# Patient Record
Sex: Male | Born: 1965 | Race: White | Hispanic: No | Marital: Married | Smoking: Former smoker
Health system: Southern US, Community
[De-identification: ages and names within clinical notes are randomized; demographics above are authoritative.]

## PROBLEM LIST (undated history)

## (undated) DIAGNOSIS — F329 Major depressive disorder, single episode, unspecified: Secondary | ICD-10-CM

## (undated) DIAGNOSIS — K219 Gastro-esophageal reflux disease without esophagitis: Secondary | ICD-10-CM

## (undated) DIAGNOSIS — M1990 Unspecified osteoarthritis, unspecified site: Secondary | ICD-10-CM

## (undated) DIAGNOSIS — I10 Essential (primary) hypertension: Secondary | ICD-10-CM

## (undated) DIAGNOSIS — F32A Depression, unspecified: Secondary | ICD-10-CM

## (undated) HISTORY — PX: WISDOM TOOTH EXTRACTION: SHX21

---

## 1992-03-14 HISTORY — PX: KNEE ARTHROSCOPY: SHX127

## 2010-02-26 ENCOUNTER — Encounter
Admission: RE | Admit: 2010-02-26 | Discharge: 2010-02-26 | Payer: Self-pay | Source: Home / Self Care | Attending: Emergency Medicine | Admitting: Emergency Medicine

## 2011-06-27 ENCOUNTER — Ambulatory Visit: Payer: Worker's Compensation | Attending: Occupational Medicine | Admitting: Physical Therapy

## 2011-06-27 DIAGNOSIS — M25569 Pain in unspecified knee: Secondary | ICD-10-CM | POA: Insufficient documentation

## 2011-06-27 DIAGNOSIS — IMO0001 Reserved for inherently not codable concepts without codable children: Secondary | ICD-10-CM | POA: Insufficient documentation

## 2011-06-27 DIAGNOSIS — M6281 Muscle weakness (generalized): Secondary | ICD-10-CM | POA: Insufficient documentation

## 2011-06-27 DIAGNOSIS — R269 Unspecified abnormalities of gait and mobility: Secondary | ICD-10-CM | POA: Insufficient documentation

## 2011-06-29 ENCOUNTER — Encounter: Payer: Self-pay | Admitting: Physical Therapy

## 2011-06-30 ENCOUNTER — Ambulatory Visit: Payer: Worker's Compensation | Attending: Family Medicine | Admitting: Physical Therapy

## 2011-06-30 DIAGNOSIS — M25569 Pain in unspecified knee: Secondary | ICD-10-CM | POA: Insufficient documentation

## 2011-06-30 DIAGNOSIS — R269 Unspecified abnormalities of gait and mobility: Secondary | ICD-10-CM | POA: Insufficient documentation

## 2011-06-30 DIAGNOSIS — IMO0001 Reserved for inherently not codable concepts without codable children: Secondary | ICD-10-CM | POA: Insufficient documentation

## 2011-06-30 DIAGNOSIS — M6281 Muscle weakness (generalized): Secondary | ICD-10-CM | POA: Insufficient documentation

## 2011-07-01 ENCOUNTER — Ambulatory Visit: Payer: Worker's Compensation | Admitting: Physical Therapy

## 2011-07-04 ENCOUNTER — Ambulatory Visit: Payer: Worker's Compensation | Attending: Occupational Medicine | Admitting: Physical Therapy

## 2011-07-04 DIAGNOSIS — R269 Unspecified abnormalities of gait and mobility: Secondary | ICD-10-CM | POA: Insufficient documentation

## 2011-07-04 DIAGNOSIS — IMO0001 Reserved for inherently not codable concepts without codable children: Secondary | ICD-10-CM | POA: Insufficient documentation

## 2011-07-04 DIAGNOSIS — M25569 Pain in unspecified knee: Secondary | ICD-10-CM | POA: Insufficient documentation

## 2011-07-04 DIAGNOSIS — M6281 Muscle weakness (generalized): Secondary | ICD-10-CM | POA: Insufficient documentation

## 2011-07-06 ENCOUNTER — Ambulatory Visit: Payer: Worker's Compensation | Admitting: Physical Therapy

## 2011-07-08 ENCOUNTER — Encounter: Payer: Self-pay | Admitting: Physical Therapy

## 2011-07-11 ENCOUNTER — Ambulatory Visit: Payer: Worker's Compensation | Admitting: Physical Therapy

## 2011-07-13 ENCOUNTER — Ambulatory Visit: Payer: Worker's Compensation | Attending: Occupational Medicine | Admitting: Physical Therapy

## 2011-07-13 DIAGNOSIS — R269 Unspecified abnormalities of gait and mobility: Secondary | ICD-10-CM | POA: Insufficient documentation

## 2011-07-13 DIAGNOSIS — M25569 Pain in unspecified knee: Secondary | ICD-10-CM | POA: Insufficient documentation

## 2011-07-13 DIAGNOSIS — M6281 Muscle weakness (generalized): Secondary | ICD-10-CM | POA: Insufficient documentation

## 2011-07-13 DIAGNOSIS — IMO0001 Reserved for inherently not codable concepts without codable children: Secondary | ICD-10-CM | POA: Insufficient documentation

## 2011-07-15 ENCOUNTER — Ambulatory Visit: Payer: Worker's Compensation | Attending: Occupational Medicine | Admitting: Physical Therapy

## 2011-07-15 DIAGNOSIS — M6281 Muscle weakness (generalized): Secondary | ICD-10-CM | POA: Insufficient documentation

## 2011-07-15 DIAGNOSIS — M25569 Pain in unspecified knee: Secondary | ICD-10-CM | POA: Insufficient documentation

## 2011-07-15 DIAGNOSIS — R269 Unspecified abnormalities of gait and mobility: Secondary | ICD-10-CM | POA: Insufficient documentation

## 2011-07-15 DIAGNOSIS — IMO0001 Reserved for inherently not codable concepts without codable children: Secondary | ICD-10-CM | POA: Insufficient documentation

## 2011-07-18 ENCOUNTER — Ambulatory Visit: Payer: Worker's Compensation | Attending: Occupational Medicine | Admitting: Physical Therapy

## 2011-07-18 DIAGNOSIS — R269 Unspecified abnormalities of gait and mobility: Secondary | ICD-10-CM | POA: Insufficient documentation

## 2011-07-18 DIAGNOSIS — M6281 Muscle weakness (generalized): Secondary | ICD-10-CM | POA: Insufficient documentation

## 2011-07-18 DIAGNOSIS — IMO0001 Reserved for inherently not codable concepts without codable children: Secondary | ICD-10-CM | POA: Insufficient documentation

## 2011-07-18 DIAGNOSIS — M25569 Pain in unspecified knee: Secondary | ICD-10-CM | POA: Insufficient documentation

## 2011-07-20 ENCOUNTER — Ambulatory Visit: Payer: Worker's Compensation | Attending: Occupational Medicine | Admitting: Physical Therapy

## 2011-07-20 DIAGNOSIS — R269 Unspecified abnormalities of gait and mobility: Secondary | ICD-10-CM | POA: Insufficient documentation

## 2011-07-20 DIAGNOSIS — M6281 Muscle weakness (generalized): Secondary | ICD-10-CM | POA: Insufficient documentation

## 2011-07-20 DIAGNOSIS — IMO0001 Reserved for inherently not codable concepts without codable children: Secondary | ICD-10-CM | POA: Insufficient documentation

## 2011-07-20 DIAGNOSIS — M25569 Pain in unspecified knee: Secondary | ICD-10-CM | POA: Insufficient documentation

## 2011-07-22 ENCOUNTER — Ambulatory Visit: Payer: Worker's Compensation | Attending: Occupational Medicine | Admitting: Physical Therapy

## 2011-07-22 DIAGNOSIS — IMO0001 Reserved for inherently not codable concepts without codable children: Secondary | ICD-10-CM | POA: Insufficient documentation

## 2011-07-22 DIAGNOSIS — M6281 Muscle weakness (generalized): Secondary | ICD-10-CM | POA: Insufficient documentation

## 2011-07-22 DIAGNOSIS — M25569 Pain in unspecified knee: Secondary | ICD-10-CM | POA: Insufficient documentation

## 2011-07-22 DIAGNOSIS — R269 Unspecified abnormalities of gait and mobility: Secondary | ICD-10-CM | POA: Insufficient documentation

## 2011-07-25 ENCOUNTER — Ambulatory Visit: Payer: Worker's Compensation | Attending: Occupational Medicine | Admitting: Physical Therapy

## 2011-07-25 DIAGNOSIS — M25569 Pain in unspecified knee: Secondary | ICD-10-CM | POA: Insufficient documentation

## 2011-07-25 DIAGNOSIS — M6281 Muscle weakness (generalized): Secondary | ICD-10-CM | POA: Insufficient documentation

## 2011-07-25 DIAGNOSIS — IMO0001 Reserved for inherently not codable concepts without codable children: Secondary | ICD-10-CM | POA: Insufficient documentation

## 2011-07-25 DIAGNOSIS — R269 Unspecified abnormalities of gait and mobility: Secondary | ICD-10-CM | POA: Insufficient documentation

## 2011-07-28 ENCOUNTER — Ambulatory Visit: Payer: Worker's Compensation | Attending: Occupational Medicine | Admitting: Physical Therapy

## 2011-07-28 DIAGNOSIS — M6281 Muscle weakness (generalized): Secondary | ICD-10-CM | POA: Insufficient documentation

## 2011-07-28 DIAGNOSIS — M25569 Pain in unspecified knee: Secondary | ICD-10-CM | POA: Insufficient documentation

## 2011-07-28 DIAGNOSIS — IMO0001 Reserved for inherently not codable concepts without codable children: Secondary | ICD-10-CM | POA: Insufficient documentation

## 2011-07-28 DIAGNOSIS — R269 Unspecified abnormalities of gait and mobility: Secondary | ICD-10-CM | POA: Insufficient documentation

## 2011-08-01 ENCOUNTER — Ambulatory Visit: Payer: Worker's Compensation | Attending: Occupational Medicine | Admitting: Physical Therapy

## 2011-08-01 DIAGNOSIS — M6281 Muscle weakness (generalized): Secondary | ICD-10-CM | POA: Insufficient documentation

## 2011-08-01 DIAGNOSIS — IMO0001 Reserved for inherently not codable concepts without codable children: Secondary | ICD-10-CM | POA: Insufficient documentation

## 2011-08-01 DIAGNOSIS — R269 Unspecified abnormalities of gait and mobility: Secondary | ICD-10-CM | POA: Insufficient documentation

## 2011-08-01 DIAGNOSIS — M25569 Pain in unspecified knee: Secondary | ICD-10-CM | POA: Insufficient documentation

## 2011-09-08 ENCOUNTER — Encounter (HOSPITAL_BASED_OUTPATIENT_CLINIC_OR_DEPARTMENT_OTHER): Payer: Self-pay | Admitting: *Deleted

## 2011-09-08 NOTE — Progress Notes (Signed)
No labs needed

## 2011-09-09 ENCOUNTER — Other Ambulatory Visit: Payer: Self-pay | Admitting: Orthopedic Surgery
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
## 2011-09-12 ENCOUNTER — Encounter (HOSPITAL_BASED_OUTPATIENT_CLINIC_OR_DEPARTMENT_OTHER): Payer: Self-pay

## 2011-09-12 ENCOUNTER — Encounter (HOSPITAL_BASED_OUTPATIENT_CLINIC_OR_DEPARTMENT_OTHER): Payer: Self-pay | Admitting: Anesthesiology

## 2011-09-12 ENCOUNTER — Ambulatory Visit (HOSPITAL_BASED_OUTPATIENT_CLINIC_OR_DEPARTMENT_OTHER)
Admission: RE | Admit: 2011-09-12 | Discharge: 2011-09-12 | Disposition: A | Discharge status: Home / Self Care | Payer: Worker's Compensation | Source: Ambulatory Visit | Attending: Orthopedic Surgery | Admitting: Orthopedic Surgery

## 2011-09-12 ENCOUNTER — Encounter (HOSPITAL_BASED_OUTPATIENT_CLINIC_OR_DEPARTMENT_OTHER)
Admission: RE | Disposition: A | Discharge status: Home / Self Care | Payer: Self-pay | Source: Ambulatory Visit | Attending: Orthopedic Surgery

## 2011-09-12 ENCOUNTER — Ambulatory Visit (HOSPITAL_BASED_OUTPATIENT_CLINIC_OR_DEPARTMENT_OTHER): Payer: Worker's Compensation | Admitting: Anesthesiology

## 2011-09-12 DIAGNOSIS — Y990 Civilian activity done for income or pay: Secondary | ICD-10-CM | POA: Insufficient documentation

## 2011-09-12 DIAGNOSIS — M2340 Loose body in knee, unspecified knee: Secondary | ICD-10-CM | POA: Insufficient documentation

## 2011-09-12 DIAGNOSIS — M94262 Chondromalacia, left knee: Secondary | ICD-10-CM

## 2011-09-12 DIAGNOSIS — M2240 Chondromalacia patellae, unspecified knee: Secondary | ICD-10-CM | POA: Insufficient documentation

## 2011-09-12 DIAGNOSIS — IMO0002 Reserved for concepts with insufficient information to code with codable children: Secondary | ICD-10-CM | POA: Insufficient documentation

## 2011-09-12 HISTORY — PX: KNEE ARTHROSCOPY: SHX127

## 2011-09-12 HISTORY — DX: Major depressive disorder, single episode, unspecified: F32.9

## 2011-09-12 HISTORY — DX: Gastro-esophageal reflux disease without esophagitis: K21.9

## 2011-09-12 HISTORY — DX: Depression, unspecified: F32.A

## 2011-09-12 LAB — POCT HEMOGLOBIN-HEMACUE: Hemoglobin: 12 g/dL — ABNORMAL LOW (ref 13.0–17.0)

## 2011-09-12 SURGERY — ARTHROSCOPY, KNEE
Anesthesia: General | Site: Knee | Laterality: Left | Wound class: Clean

## 2011-09-12 MED ORDER — HYDROCODONE-ACETAMINOPHEN 5-325 MG PO TABS: 1.0000 | ORAL_TABLET | ORAL | Status: AC | PRN

## 2011-09-12 MED ORDER — LACTATED RINGERS IV SOLN
INTRAVENOUS | Status: DC
  Administered 2011-09-12 (×3): via INTRAVENOUS

## 2011-09-12 MED ORDER — MIDAZOLAM HCL 5 MG/5ML IJ SOLN
INTRAMUSCULAR | Status: DC | PRN
  Administered 2011-09-12: 2 mg via INTRAVENOUS

## 2011-09-12 MED ORDER — LIDOCAINE HCL (CARDIAC) 20 MG/ML IV SOLN
INTRAVENOUS | Status: DC | PRN
  Administered 2011-09-12: 100 mg via INTRAVENOUS

## 2011-09-12 MED ORDER — FENTANYL CITRATE 0.05 MG/ML IJ SOLN
INTRAMUSCULAR | Status: DC | PRN
  Administered 2011-09-12: 50 ug via INTRAVENOUS
  Administered 2011-09-12: 1 ug via INTRAVENOUS

## 2011-09-12 MED ORDER — DEXTROSE-NACL 5-0.45 % IV SOLN: INTRAVENOUS | Status: DC

## 2011-09-12 MED ORDER — CHLORHEXIDINE GLUCONATE 4 % EX LIQD: 60.0000 mL | Freq: Once | CUTANEOUS | Status: DC

## 2011-09-12 MED ORDER — SODIUM CHLORIDE 0.9 % IR SOLN
Status: DC | PRN
  Administered 2011-09-12: 3000 mL

## 2011-09-12 MED ORDER — FENTANYL CITRATE 0.05 MG/ML IJ SOLN: 25.0000 ug | INTRAMUSCULAR | Status: DC | PRN

## 2011-09-12 MED ORDER — OXYCODONE HCL 5 MG PO TABS
5.0000 mg | ORAL_TABLET | Freq: Once | ORAL | Status: AC | PRN
  Administered 2011-09-12: 5 mg via ORAL

## 2011-09-12 MED ORDER — BUPIVACAINE-EPINEPHRINE 0.5% -1:200000 IJ SOLN
INTRAMUSCULAR | Status: DC | PRN
  Administered 2011-09-12: 20 mL

## 2011-09-12 MED ORDER — METOCLOPRAMIDE HCL 5 MG/ML IJ SOLN: 10.0000 mg | Freq: Once | INTRAMUSCULAR | Status: DC | PRN

## 2011-09-12 MED ORDER — VANCOMYCIN HCL IN DEXTROSE 1-5 GM/200ML-% IV SOLN
1000.0000 mg | INTRAVENOUS | Status: AC
  Administered 2011-09-12: 1000 mg via INTRAVENOUS

## 2011-09-12 MED ORDER — ONDANSETRON HCL 4 MG/2ML IJ SOLN
INTRAMUSCULAR | Status: DC | PRN
  Administered 2011-09-12: 4 mg via INTRAVENOUS

## 2011-09-12 MED ORDER — DEXAMETHASONE SODIUM PHOSPHATE 4 MG/ML IJ SOLN
INTRAMUSCULAR | Status: DC | PRN
  Administered 2011-09-12: 10 mg via INTRAVENOUS

## 2011-09-12 MED ORDER — PROPOFOL 10 MG/ML IV EMUL
INTRAVENOUS | Status: DC | PRN
  Administered 2011-09-12: 200 mg via INTRAVENOUS

## 2011-09-12 SURGICAL SUPPLY — 37 items
BANDAGE ELASTIC 6 VELCRO ST LF (GAUZE/BANDAGES/DRESSINGS) ×2 IMPLANT
BLADE 4.2CUDA (BLADE) IMPLANT
BLADE CUTTER GATOR 3.5 (BLADE) ×2 IMPLANT
BLADE GREAT WHITE 4.2 (BLADE) IMPLANT
CANISTER OMNI JUG 16 LITER (MISCELLANEOUS) ×2 IMPLANT
CANISTER SUCTION 2500CC (MISCELLANEOUS) IMPLANT
CHLORAPREP W/TINT 26ML (MISCELLANEOUS) ×2 IMPLANT
CLOTH BEACON ORANGE TIMEOUT ST (SAFETY) ×2 IMPLANT
DRAPE ARTHROSCOPY W/POUCH 114 (DRAPES) ×2 IMPLANT
ELECT MENISCUS 165MM 90D (ELECTRODE) IMPLANT
ELECT REM PT RETURN 9FT ADLT (ELECTROSURGICAL)
ELECTRODE REM PT RTRN 9FT ADLT (ELECTROSURGICAL) IMPLANT
GAUZE XEROFORM 1X8 LF (GAUZE/BANDAGES/DRESSINGS) ×2 IMPLANT
GLOVE BIO SURGEON STRL SZ7 (GLOVE) ×2 IMPLANT
GLOVE BIO SURGEON STRL SZ7.5 (GLOVE) ×2 IMPLANT
GLOVE BIOGEL PI IND STRL 7.0 (GLOVE) ×1 IMPLANT
GLOVE BIOGEL PI IND STRL 8 (GLOVE) ×1 IMPLANT
GLOVE BIOGEL PI INDICATOR 7.0 (GLOVE) ×1
GLOVE BIOGEL PI INDICATOR 8 (GLOVE) ×1
GOWN PREVENTION PLUS XLARGE (GOWN DISPOSABLE) ×2 IMPLANT
KNEE WRAP E Z 3 GEL PACK (MISCELLANEOUS) ×2 IMPLANT
NDL SAFETY ECLIPSE 18X1.5 (NEEDLE) IMPLANT
NEEDLE FILTER BLUNT 18X 1/2SAF (NEEDLE) ×1
NEEDLE FILTER BLUNT 18X1 1/2 (NEEDLE) ×1 IMPLANT
NEEDLE HYPO 18GX1.5 SHARP (NEEDLE)
PACK ARTHROSCOPY DSU (CUSTOM PROCEDURE TRAY) ×2 IMPLANT
PACK BASIN DAY SURGERY FS (CUSTOM PROCEDURE TRAY) ×2 IMPLANT
PENCIL BUTTON HOLSTER BLD 10FT (ELECTRODE) IMPLANT
SET ARTHROSCOPY TUBING (MISCELLANEOUS) ×1
SET ARTHROSCOPY TUBING LN (MISCELLANEOUS) ×1 IMPLANT
SLEEVE SCD COMPRESS KNEE MED (MISCELLANEOUS) IMPLANT
SPONGE GAUZE 4X4 12PLY (GAUZE/BANDAGES/DRESSINGS) ×2 IMPLANT
SYR 3ML 18GX1 1/2 (SYRINGE) IMPLANT
SYR 5ML LL (SYRINGE) ×2 IMPLANT
TOWEL OR 17X24 6PK STRL BLUE (TOWEL DISPOSABLE) ×2 IMPLANT
WAND STAR VAC 90 (SURGICAL WAND) IMPLANT
WATER STERILE IRR 1000ML POUR (IV SOLUTION) ×2 IMPLANT

## 2011-09-12 NOTE — Anesthesia Postprocedure Evaluation (Signed)
  Anesthesia Post-op Note  Patient: Brandon Fitzpatrick  Procedure(s) Performed: Procedure(s) (LRB): ARTHROSCOPY KNEE (Left)  Patient Location: PACU  Anesthesia Type: General  Level of Consciousness: awake  Airway and Oxygen Therapy: Patient Spontanous Breathing  Post-op Pain: mild  Post-op Assessment: Post-op Vital signs reviewed  Post-op Vital Signs: stable  Complications: No apparent anesthesia complications

## 2011-09-12 NOTE — Discharge Instructions (Signed)
Post Anesthesia Home Care Instructions  Activity: Get plenty of rest for the remainder of the day. A responsible adult should stay with you for 24 hours following the procedure.  For the next 24 hours, DO NOT: -Drive a car -Advertising copywriter -Drink alcoholic beverages -Take any medication unless instructed by your physician -Make any legal decisions or sign important papers.  Meals: Start with liquid foods such as gelatin or soup. Progress to regular foods as tolerated. Avoid greasy, spicy, heavy foods. If nausea and/or vomiting occur, drink only clear liquids until the nausea and/or vomiting subsides. Call your physician if vomiting continues.  Special Instructions/Symptoms: Your throat may feel dry or sore from the anesthesia or the breathing tube placed in your throat during surgery. If this causes discomfort, gargle with warm salt water. The discomfort should disappear within 24 hours.    Discharge Instructions After Orthopedic Procedures:  *You may feel tired and weak following your procedure. It is recommended that you limit physical activity for the next 24 hours and rest at home for the remainder of today and tomorrow. *No strenuous activity should be started without your doctor's permission.  Elevate the extremity that you had surgery on to a level above your heart. This should continue for 48 hours or as instructed by your doctor.  If you had hand, arm or shoulder surgery you should move your fingers frequently unless otherwise instructed by your doctor.  If you had foot, knee or leg surgery you should wiggle your toes frequently unless otherwise instructed by your doctor.  Follow your doctor's exact instructions for activity at home. Use your home equipment as instructed. (Crutches, hard shoes, slings etc.)  Limit your activity as instructed by your doctor.  Report to your doctor should any of the following occur: 1. Extreme swelling of your fingers or toes. 2.  Inability to wiggle your fingers or toes. 3. Coldness, pale or bluish color in your fingers or toes. 4. Loss of sensation, numbness or tingling of your fingers or toes. 5. Unusual smell or odor from under your dressing or cast. 6. Excessive bleeding or drainage from the surgical site. 7. Pain not relieved by medication your doctor has prescribed for you. 8. Cast or dressing too tight (do not get your dressing or cast wet or put anything under          your dressing or cast.)  *Do not change your dressing unless instructed by your doctor or discharge nurse. Then follow exact instructions.  *Follow labeled instructions for any medications that your doctor may have prescribed for you. *Should any questions or complications develop following your procedure, PLEASE CONTACT YOUR DOCTOR.      Post Anesthesia Home Care Instructions  Activity: Get plenty of rest for the remainder of the day. A responsible adult should stay with you for 24 hours following the procedure.  For the next 24 hours, DO NOT: -Drive a car -Advertising copywriter -Drink alcoholic beverages -Take any medication unless instructed by your physician -Make any legal decisions or sign important papers.  Meals: Start with liquid foods such as gelatin or soup. Progress to regular foods as tolerated. Avoid greasy, spicy, heavy foods. If nausea and/or vomiting occur, drink only clear liquids until the nausea and/or vomiting subsides. Call your physician if vomiting continues.  Special Instructions/Symptoms: Your throat may feel dry or sore from the anesthesia or the breathing tube placed in your throat during surgery. If this causes discomfort, gargle with warm salt water. The discomfort should  disappear within 24 hours.

## 2011-09-12 NOTE — Interval H&P Note (Signed)
History and Physical Interval Note:  09/12/2011 11:22 AM  Brandon Fitzpatrick  has presented today for surgery, with the diagnosis of left knee chondromalacia w/ flap tear  The various methods of treatment have been discussed with the patient and family. After consideration of risks, benefits and other options for treatment, the patient has consented to  Procedure(s) (LRB): ARTHROSCOPY KNEE (Left) as a surgical intervention .  The patient's history has been reviewed, patient examined, no change in status, stable for surgery.  I have reviewed the patients' chart and labs.  Questions were answered to the patient's satisfaction.     Nestor Lewandowsky

## 2011-09-12 NOTE — Anesthesia Procedure Notes (Signed)
Procedure Name: LMA Insertion Date/Time: 09/12/2011 11:28 AM Performed by: Gar Gibbon Pre-anesthesia Checklist: Patient identified, Emergency Drugs available, Suction available and Patient being monitored Patient Re-evaluated:Patient Re-evaluated prior to inductionOxygen Delivery Method: Circle System Utilized Preoxygenation: Pre-oxygenation with 100% oxygen Intubation Type: IV induction Ventilation: Mask ventilation without difficulty LMA: LMA inserted LMA Size: 4.0 Number of attempts: 1 Airway Equipment and Method: bite block Placement Confirmation: positive ETCO2 Tube secured with: Tape Dental Injury: Teeth and Oropharynx as per pre-operative assessment

## 2011-09-12 NOTE — Op Note (Signed)
Pre-Op Dx: Left knee medial meniscal tear, possible loose body, chondromalacia  Postop Dx: Same   Procedure: Left knee arthroscopic partial medial meniscectomy inner third posterior horn rim, removal of cartilaginous loose body 2 x 6 x 10 mm, debridement chondromalacia medial femoral condyle grade 3 with flap tears and medial trochlea.  Surgeon: Feliberto Gottron. Turner Daniels M.D.  Assist: Shirl Harris PA-C  Anes: General LMA  EBL: Minimal  Fluids: 800 cc   Indications: Patient has a 2 to three-month history of catching popping and pain to the medial aspect of his left knee. The pain is intermittent occasionally a snack she locks up and he desires arthroscopic evaluation and treatment. Pt has failed conservative treatment with anti-inflammatory medicines, physical therapy, and modified activites but did get good temporarily from an intra-articular cortisone injection. Pain has recurred and patient desires elective arthroscopic evaluation and treatment of knee. Risks and benefits of surgery have been discussed and questions answered.  Procedure: Patient identified by arm band and taken to the operating room at the day surgery Center. The appropriate anesthetic monitors were attached, and General LMA anesthesia was induced without difficulty. Lateral post was applied to the table and the lower extremity was prepped and draped in usual sterile fashion from the ankle to the midthigh. Time out procedure was performed. We began the operation by making standard inferior lateral and inferior medial peripatellar portals with a #11 blade allowing introduction of the arthroscope through the inferior lateral portal and the out flow to the inferior medial portal. Pump pressure was set at 100 mmHg and diagnostic arthroscopy  revealed normal articular surface of the patella, grade 3 chondromalacia medial trochlea with flap tears that was debridement back to a stable margin with a 3.5 mm Gator sucker shaver. Moving into the  medial compartment we medially encountered a cartilaginous loose body 2 x 6 x 10 mm in size and this was removed with the Gator sucker shaver. The posterior horn of the medial meniscus had degenerative tearing in the inner third of the RAM was removed with a small biter and the 35 Gator sucker shaver. The anterior cruciate ligament and PCL were intact. The lateral compartment was in excellent condition with no chondromalacia and no degenerative tearing of the menisci.. The knee was irrigated out normal saline solution. A dressing of xerofoam 4 x 4 dressing sponges, web roll and an Ace wrap was applied. The patient was awakened extubated and taken to the recovery without difficulty.    Signed: Nestor Lewandowsky, MD

## 2011-09-12 NOTE — Transfer of Care (Signed)
Immediate Anesthesia Transfer of Care Note  Patient: Brandon Fitzpatrick  Procedure(s) Performed: Procedure(s) (LRB): ARTHROSCOPY KNEE (Left)  Patient Location: PACU  Anesthesia Type: General  Level of Consciousness: sedated  Airway & Oxygen Therapy: Patient Spontanous Breathing and Patient connected to face mask oxygen  Post-op Assessment: Report given to PACU RN and Post -op Vital signs reviewed and stable  Post vital signs: Reviewed and stable  Complications: No apparent anesthesia complications

## 2011-09-12 NOTE — Anesthesia Preprocedure Evaluation (Signed)
Anesthesia Evaluation  Patient identified by MRN, date of birth, ID band Patient awake    Reviewed: Allergy & Precautions, H&P , NPO status , Patient's Chart, lab work & pertinent test results, reviewed documented beta blocker date and time   Airway Mallampati: II TM Distance: >3 FB Neck ROM: full    Dental   Pulmonary neg pulmonary ROS,          Cardiovascular negative cardio ROS      Neuro/Psych negative neurological ROS  negative psych ROS   GI/Hepatic Neg liver ROS, Medicated and Controlled,  Endo/Other  negative endocrine ROS  Renal/GU negative Renal ROS  negative genitourinary   Musculoskeletal   Abdominal   Peds  Hematology negative hematology ROS (+)   Anesthesia Other Findings See surgeon's H&P   Reproductive/Obstetrics negative OB ROS                           Anesthesia Physical Anesthesia Plan  ASA: I  Anesthesia Plan: General   Post-op Pain Management:    Induction: Intravenous  Airway Management Planned: LMA  Additional Equipment:   Intra-op Plan:   Post-operative Plan: Extubation in OR  Informed Consent: I have reviewed the patients History and Physical, chart, labs and discussed the procedure including the risks, benefits and alternatives for the proposed anesthesia with the patient or authorized representative who has indicated his/her understanding and acceptance.   Dental Advisory Given  Plan Discussed with: CRNA and Surgeon  Anesthesia Plan Comments:         Anesthesia Quick Evaluation

## 2011-09-16 ENCOUNTER — Encounter (HOSPITAL_BASED_OUTPATIENT_CLINIC_OR_DEPARTMENT_OTHER): Payer: Self-pay | Admitting: Orthopedic Surgery

## 2011-09-19 ENCOUNTER — Encounter (HOSPITAL_BASED_OUTPATIENT_CLINIC_OR_DEPARTMENT_OTHER): Payer: Self-pay

## 2012-03-29 ENCOUNTER — Other Ambulatory Visit: Payer: Self-pay | Admitting: Orthopedic Surgery

## 2012-03-29 ENCOUNTER — Encounter (HOSPITAL_BASED_OUTPATIENT_CLINIC_OR_DEPARTMENT_OTHER): Payer: Self-pay | Admitting: *Deleted

## 2012-03-29 NOTE — H&P (Signed)
Subjective: Brandon Fitzpatrick returns in followup for his left knee.  He is status post arthroscopy in July of 2013.  At the time of surgery he was found to have a medial meniscus tear and grade 3 chondromalacia.  He did very well after surgery but had recurrence of his pain in December without a new injury.  He localizes his pain to the posterior aspect of the knee and complains of associated swelling.  He had a cortisone injection that provided him with 2 or 3 hours of complete pain relief.  At his last visit, Dr. Turner Daniels ordered a new MRI and he comes in today to review the results.  He is seen in consultation with his rehabilitation nurse Brandon Fitzpatrick.  All: Penicillin  ROS: 14 point review of systems form filled out by the patient was reviewed and was negative as it relates to the history of present illness except for: Contributory  PMH:.  No history of blood thinners only medicine listed is Prevacid  FHx: Diabetes, high blood pressure and arthritis  SocHx:.  He does not smoke and he only has an occasional drink of alcohol socially.  He is married and is here with his wife.  Objective: Exam of the left knee demonstrates no obvious effusion.  Range of motion is 0-120.  He has pain with full extension and deep flexion.  McMurray's test elicits pain but no palpable click.  He is mildly tender to palpation along the medial joint line.  Neurovascular exam is within normal limits.  MRI of the left knee shows blunting of the edge of the medial meniscus that may be postsurgical changes.  There is stable grade 3 chondromalacia.  Assess: Recurrent left knee pain with positive response to anesthetic injection  Plan: Based on Brandon Fitzpatrick response to the injection, we'd like to seek permission from his Worker's Compensation carrier for a repeat knee arthroscopy.  He has known chondromalacia and may have a new flap tear that is causing pain and interfering with his ability to do his job.  In the meantime he was  given another note for seated work with frequent position changes.  He will continue using his knee brace.

## 2012-03-29 NOTE — Progress Notes (Signed)
Pt was here for this knee 7/13-did well-no labs needed-to bring crutches

## 2012-04-02 ENCOUNTER — Encounter (HOSPITAL_BASED_OUTPATIENT_CLINIC_OR_DEPARTMENT_OTHER): Payer: Self-pay | Admitting: Anesthesiology

## 2012-04-02 ENCOUNTER — Ambulatory Visit (HOSPITAL_BASED_OUTPATIENT_CLINIC_OR_DEPARTMENT_OTHER)
Admission: RE | Admit: 2012-04-02 | Discharge: 2012-04-02 | Disposition: A | Discharge status: Home / Self Care | Payer: Worker's Compensation | Source: Ambulatory Visit | Attending: Orthopedic Surgery | Admitting: Orthopedic Surgery

## 2012-04-02 ENCOUNTER — Encounter (HOSPITAL_BASED_OUTPATIENT_CLINIC_OR_DEPARTMENT_OTHER)
Admission: RE | Disposition: A | Discharge status: Home / Self Care | Payer: Self-pay | Source: Ambulatory Visit | Attending: Orthopedic Surgery

## 2012-04-02 ENCOUNTER — Ambulatory Visit (HOSPITAL_BASED_OUTPATIENT_CLINIC_OR_DEPARTMENT_OTHER): Payer: Worker's Compensation | Admitting: Anesthesiology

## 2012-04-02 ENCOUNTER — Encounter (HOSPITAL_BASED_OUTPATIENT_CLINIC_OR_DEPARTMENT_OTHER): Payer: Self-pay | Admitting: *Deleted

## 2012-04-02 DIAGNOSIS — K219 Gastro-esophageal reflux disease without esophagitis: Secondary | ICD-10-CM | POA: Insufficient documentation

## 2012-04-02 DIAGNOSIS — F329 Major depressive disorder, single episode, unspecified: Secondary | ICD-10-CM | POA: Insufficient documentation

## 2012-04-02 DIAGNOSIS — M9420 Chondromalacia, unspecified site: Secondary | ICD-10-CM | POA: Insufficient documentation

## 2012-04-02 DIAGNOSIS — F3289 Other specified depressive episodes: Secondary | ICD-10-CM | POA: Insufficient documentation

## 2012-04-02 DIAGNOSIS — M94262 Chondromalacia, left knee: Secondary | ICD-10-CM

## 2012-04-02 HISTORY — PX: KNEE ARTHROSCOPY: SHX127

## 2012-04-02 LAB — POCT HEMOGLOBIN-HEMACUE: Hemoglobin: 15.6 g/dL (ref 13.0–17.0)

## 2012-04-02 SURGERY — ARTHROSCOPY, KNEE
Anesthesia: General | Site: Knee | Laterality: Left | Wound class: Clean

## 2012-04-02 MED ORDER — BUPIVACAINE-EPINEPHRINE 0.5% -1:200000 IJ SOLN
INTRAMUSCULAR | Status: DC | PRN
  Administered 2012-04-02: 20 mL

## 2012-04-02 MED ORDER — SODIUM CHLORIDE 0.9 % IR SOLN
Status: DC | PRN
  Administered 2012-04-02: 3000 mL

## 2012-04-02 MED ORDER — DEXAMETHASONE SODIUM PHOSPHATE 4 MG/ML IJ SOLN
INTRAMUSCULAR | Status: DC | PRN
  Administered 2012-04-02: 10 mg via INTRAVENOUS

## 2012-04-02 MED ORDER — MIDAZOLAM HCL 5 MG/5ML IJ SOLN
INTRAMUSCULAR | Status: DC | PRN
  Administered 2012-04-02: 2 mg via INTRAVENOUS

## 2012-04-02 MED ORDER — VANCOMYCIN HCL IN DEXTROSE 1-5 GM/200ML-% IV SOLN
1000.0000 mg | INTRAVENOUS | Status: AC
  Administered 2012-04-02: 1000 mg via INTRAVENOUS

## 2012-04-02 MED ORDER — OXYCODONE HCL 5 MG PO TABS: 5.0000 mg | ORAL_TABLET | Freq: Once | ORAL | Status: DC | PRN

## 2012-04-02 MED ORDER — PROPOFOL 10 MG/ML IV BOLUS
INTRAVENOUS | Status: DC | PRN
  Administered 2012-04-02: 200 mg via INTRAVENOUS

## 2012-04-02 MED ORDER — OXYCODONE HCL 5 MG/5ML PO SOLN: 5.0000 mg | Freq: Once | ORAL | Status: DC | PRN

## 2012-04-02 MED ORDER — ACETAMINOPHEN 10 MG/ML IV SOLN: 1000.0000 mg | Freq: Once | INTRAVENOUS | Status: DC

## 2012-04-02 MED ORDER — FENTANYL CITRATE 0.05 MG/ML IJ SOLN: 50.0000 ug | Freq: Once | INTRAMUSCULAR | Status: DC

## 2012-04-02 MED ORDER — EPINEPHRINE HCL 1 MG/ML IJ SOLN
INTRAMUSCULAR | Status: DC | PRN
  Administered 2012-04-02: 1 mg

## 2012-04-02 MED ORDER — HYDROCODONE-ACETAMINOPHEN 5-325 MG PO TABS: 1.0000 | ORAL_TABLET | ORAL | Status: AC | PRN

## 2012-04-02 MED ORDER — LIDOCAINE HCL (CARDIAC) 20 MG/ML IV SOLN
INTRAVENOUS | Status: DC | PRN
  Administered 2012-04-02: 100 mg via INTRAVENOUS

## 2012-04-02 MED ORDER — DEXTROSE-NACL 5-0.45 % IV SOLN: INTRAVENOUS | Status: DC

## 2012-04-02 MED ORDER — FENTANYL CITRATE 0.05 MG/ML IJ SOLN
INTRAMUSCULAR | Status: DC | PRN
  Administered 2012-04-02: 100 ug via INTRAVENOUS
  Administered 2012-04-02: 25 ug via INTRAVENOUS

## 2012-04-02 MED ORDER — CHLORHEXIDINE GLUCONATE 4 % EX LIQD: 60.0000 mL | Freq: Once | CUTANEOUS | Status: DC

## 2012-04-02 MED ORDER — MIDAZOLAM HCL 2 MG/2ML IJ SOLN: 1.0000 mg | INTRAMUSCULAR | Status: DC | PRN

## 2012-04-02 MED ORDER — PROMETHAZINE HCL 25 MG/ML IJ SOLN: 6.2500 mg | INTRAMUSCULAR | Status: DC | PRN

## 2012-04-02 MED ORDER — LACTATED RINGERS IV SOLN
INTRAVENOUS | Status: DC
  Administered 2012-04-02: 10:00:00 via INTRAVENOUS

## 2012-04-02 MED ORDER — HYDROMORPHONE HCL PF 1 MG/ML IJ SOLN
0.2500 mg | INTRAMUSCULAR | Status: DC | PRN
  Administered 2012-04-02: 0.5 mg via INTRAVENOUS

## 2012-04-02 SURGICAL SUPPLY — 39 items
BANDAGE ELASTIC 6 VELCRO ST LF (GAUZE/BANDAGES/DRESSINGS) ×2 IMPLANT
BLADE 4.2CUDA (BLADE) IMPLANT
BLADE CUTTER GATOR 3.5 (BLADE) ×2 IMPLANT
BLADE GREAT WHITE 4.2 (BLADE) IMPLANT
CANISTER OMNI JUG 16 LITER (MISCELLANEOUS) ×2 IMPLANT
CANISTER SUCTION 2500CC (MISCELLANEOUS) IMPLANT
CHLORAPREP W/TINT 26ML (MISCELLANEOUS) ×2 IMPLANT
CLOTH BEACON ORANGE TIMEOUT ST (SAFETY) ×2 IMPLANT
DRAPE ARTHROSCOPY W/POUCH 114 (DRAPES) ×2 IMPLANT
ELECT MENISCUS 165MM 90D (ELECTRODE) IMPLANT
ELECT REM PT RETURN 9FT ADLT (ELECTROSURGICAL)
ELECTRODE REM PT RTRN 9FT ADLT (ELECTROSURGICAL) IMPLANT
GAUZE XEROFORM 1X8 LF (GAUZE/BANDAGES/DRESSINGS) ×2 IMPLANT
GLOVE BIO SURGEON STRL SZ 6.5 (GLOVE) ×2 IMPLANT
GLOVE BIO SURGEON STRL SZ7 (GLOVE) ×2 IMPLANT
GLOVE BIO SURGEON STRL SZ7.5 (GLOVE) ×2 IMPLANT
GLOVE BIOGEL PI IND STRL 7.0 (GLOVE) ×2 IMPLANT
GLOVE BIOGEL PI IND STRL 8 (GLOVE) ×1 IMPLANT
GLOVE BIOGEL PI INDICATOR 7.0 (GLOVE) ×2
GLOVE BIOGEL PI INDICATOR 8 (GLOVE) ×1
GOWN PREVENTION PLUS XLARGE (GOWN DISPOSABLE) ×6 IMPLANT
IV NS IRRIG 3000ML ARTHROMATIC (IV SOLUTION) ×4 IMPLANT
KNEE WRAP E Z 3 GEL PACK (MISCELLANEOUS) ×2 IMPLANT
NDL SAFETY ECLIPSE 18X1.5 (NEEDLE) ×1 IMPLANT
NEEDLE FILTER BLUNT 18X 1/2SAF (NEEDLE)
NEEDLE FILTER BLUNT 18X1 1/2 (NEEDLE) IMPLANT
NEEDLE HYPO 18GX1.5 SHARP (NEEDLE) ×1
PACK ARTHROSCOPY DSU (CUSTOM PROCEDURE TRAY) ×2 IMPLANT
PACK BASIN DAY SURGERY FS (CUSTOM PROCEDURE TRAY) ×2 IMPLANT
PENCIL BUTTON HOLSTER BLD 10FT (ELECTRODE) IMPLANT
SET ARTHROSCOPY TUBING (MISCELLANEOUS) ×1
SET ARTHROSCOPY TUBING LN (MISCELLANEOUS) ×1 IMPLANT
SLEEVE SCD COMPRESS KNEE MED (MISCELLANEOUS) IMPLANT
SPONGE GAUZE 4X4 12PLY (GAUZE/BANDAGES/DRESSINGS) ×2 IMPLANT
SYR 3ML 18GX1 1/2 (SYRINGE) IMPLANT
SYR 5ML LL (SYRINGE) ×2 IMPLANT
TOWEL OR 17X24 6PK STRL BLUE (TOWEL DISPOSABLE) ×2 IMPLANT
WAND STAR VAC 90 (SURGICAL WAND) IMPLANT
WATER STERILE IRR 1000ML POUR (IV SOLUTION) ×2 IMPLANT

## 2012-04-02 NOTE — Transfer of Care (Signed)
Immediate Anesthesia Transfer of Care Note  Patient: Brandon Fitzpatrick  Procedure(s) Performed: Procedure(s) (LRB) with comments: ARTHROSCOPY KNEE (Left) - WITH DEBRIDEMENT OF CHONDROMALACIA   Patient Location: PACU  Anesthesia Type:General  Level of Consciousness: sedated  Airway & Oxygen Therapy: Patient Spontanous Breathing and Patient connected to face mask oxygen  Post-op Assessment: Report given to PACU RN and Post -op Vital signs reviewed and stable  Post vital signs: Reviewed and stable  Complications: No apparent anesthesia complications

## 2012-04-02 NOTE — Interval H&P Note (Signed)
History and Physical Interval Note:  04/02/2012 10:29 AM  Brandon Fitzpatrick  has presented today for surgery, with the diagnosis of LEFT KNEE CHONDRAMALACIA POSSIBLE MEDIAL MENISCAL TEAR  The various methods of treatment have been discussed with the patient and family. After consideration of risks, benefits and other options for treatment, the patient has consented to  Procedure(s) (LRB) with comments: ARTHROSCOPY KNEE (Left) - WITH DEBRIDEMENT OF CHONDROMALACIA AND POSSIBLE PARTIAL MEDIAL MENISECOTMY as a surgical intervention .  The patient's history has been reviewed, patient examined, no change in status, stable for surgery.  I have reviewed the patient's chart and labs.  Questions were answered to the patient's satisfaction.     Nestor Lewandowsky

## 2012-04-02 NOTE — Anesthesia Procedure Notes (Signed)
Procedure Name: LMA Insertion Date/Time: 04/02/2012 10:41 AM Performed by: Gar Gibbon Pre-anesthesia Checklist: Patient identified, Emergency Drugs available, Suction available and Patient being monitored Patient Re-evaluated:Patient Re-evaluated prior to inductionOxygen Delivery Method: Circle System Utilized Preoxygenation: Pre-oxygenation with 100% oxygen Intubation Type: IV induction Ventilation: Mask ventilation without difficulty LMA: LMA inserted LMA Size: 4.0 Number of attempts: 1 Airway Equipment and Method: bite block Placement Confirmation: positive ETCO2 Tube secured with: Tape Dental Injury: Teeth and Oropharynx as per pre-operative assessment

## 2012-04-02 NOTE — Anesthesia Preprocedure Evaluation (Signed)
Anesthesia Evaluation  Patient identified by MRN, date of birth, ID band Patient awake    Reviewed: Allergy & Precautions, H&P , NPO status , Patient's Chart, lab work & pertinent test results, reviewed documented beta blocker date and time   Airway Mallampati: II TM Distance: >3 FB Neck ROM: full    Dental   Pulmonary neg pulmonary ROS, former smoker,  breath sounds clear to auscultation        Cardiovascular negative cardio ROS  Rhythm:Regular Rate:Normal     Neuro/Psych Depression negative neurological ROS  negative psych ROS   GI/Hepatic Neg liver ROS, GERD-  ,  Endo/Other  negative endocrine ROS  Renal/GU negative Renal ROS  negative genitourinary   Musculoskeletal   Abdominal   Peds  Hematology negative hematology ROS (+)   Anesthesia Other Findings See surgeon's H&P   Reproductive/Obstetrics negative OB ROS                           Anesthesia Physical Anesthesia Plan  ASA: II  Anesthesia Plan: General   Post-op Pain Management:    Induction: Intravenous  Airway Management Planned: LMA  Additional Equipment:   Intra-op Plan:   Post-operative Plan: Extubation in OR  Informed Consent: I have reviewed the patients History and Physical, chart, labs and discussed the procedure including the risks, benefits and alternatives for the proposed anesthesia with the patient or authorized representative who has indicated his/her understanding and acceptance.     Plan Discussed with: CRNA and Surgeon  Anesthesia Plan Comments:         Anesthesia Quick Evaluation

## 2012-04-02 NOTE — Anesthesia Postprocedure Evaluation (Signed)
  Anesthesia Post-op Note  Patient: Brandon Fitzpatrick  Procedure(s) Performed: Procedure(s) (LRB) with comments: ARTHROSCOPY KNEE (Left) - WITH DEBRIDEMENT OF CHONDROMALACIA   Patient Location: PACU  Anesthesia Type:General  Level of Consciousness: awake and alert   Airway and Oxygen Therapy: Patient Spontanous Breathing  Post-op Pain: mild  Post-op Assessment: Post-op Vital signs reviewed, Patient's Cardiovascular Status Stable, Respiratory Function Stable, Patent Airway, No signs of Nausea or vomiting and Pain level controlled  Post-op Vital Signs: stable  Complications: No apparent anesthesia complications

## 2012-04-02 NOTE — Op Note (Signed)
Pre-Op Dx: L knee medial meniscal tear versus chondromalacia  Postop Dx: Left knee chondromalacia trochlea grade 3 with flap tears, smaller tears on the medial femoral condyle  Procedure: Left knee arthroscopic removal of grade 3 chondromalacia flap tears from the trochlea and also a small flap tear on the medial femoral condyle  Surgeon: Feliberto Gottron. Turner Daniels M.D.  Assist: Shirl Harris PA-C  Anes: General LMA  EBL: Minimal  Fluids: 800 cc   Indications: Status post left knee arthroscopy 9 months ago did very well for 6 months and now is had recurrent pain with catching and popping in the left knee appear MRI scan was nondiagnostic, intra-articular injection of cortisone and local anesthetic provided 2-3 hours of complete pain relief. Pt has failed conservative treatment with anti-inflammatory medicines, physical therapy, and modified activites but did get good temporarily from an intra-articular cortisone injection. Pain has recurred and patient desires elective arthroscopic evaluation and treatment of knee. Risks and benefits of surgery have been discussed and questions answered.  Procedure: Patient identified by arm band and taken to the operating room at the day surgery Center. The appropriate anesthetic monitors were attached, and General LMA anesthesia was induced without difficulty. Lateral post was applied to the table and the lower extremity was prepped and draped in usual sterile fashion from the ankle to the midthigh. Time out procedure was performed. We began the operation by making standard inferior lateral and inferior medial peripatellar portals with a #11 blade allowing introduction of the arthroscope through the inferior lateral portal and the out flow to the inferior medial portal. Pump pressure was set at 100 mmHg and diagnostic arthroscopy  revealed the articular cartilage of the patella was normal, and the trochlea the patient did have grade 3 chondromalacia with flap tears, these  are debridement with a 3.5 mm Gator sucker shaver after thoroughly probing them. The medial compartment had some small chondral flap tears medial femoral condyle grade 2 the previous medial meniscectomy was probed and found to be stable. The anterior cruciate ligament and PCL were intact. The lateral compartment was pristine. The gutters were cleared medially and laterally. Taking the stroke through the notch medial and lateral to the PCL we documented normal posterior horns of the menisci.. The knee was irrigated out normal saline solution. A dressing of xerofoam 4 x 4 dressing sponges, web roll and an Ace wrap was applied. The patient was awakened extubated and taken to the recovery without difficulty.    Signed: Nestor Lewandowsky, MD

## 2012-04-04 ENCOUNTER — Encounter (HOSPITAL_BASED_OUTPATIENT_CLINIC_OR_DEPARTMENT_OTHER): Payer: Self-pay | Admitting: Orthopedic Surgery

## 2013-03-22 ENCOUNTER — Ambulatory Visit (INDEPENDENT_AMBULATORY_CARE_PROVIDER_SITE_OTHER): Payer: Self-pay

## 2013-03-22 ENCOUNTER — Other Ambulatory Visit: Payer: Self-pay | Admitting: Family Medicine

## 2013-03-22 DIAGNOSIS — M25539 Pain in unspecified wrist: Secondary | ICD-10-CM

## 2013-03-22 DIAGNOSIS — R52 Pain, unspecified: Secondary | ICD-10-CM

## 2013-03-22 DIAGNOSIS — M25439 Effusion, unspecified wrist: Secondary | ICD-10-CM

## 2015-04-22 DIAGNOSIS — M722 Plantar fascial fibromatosis: Secondary | ICD-10-CM | POA: Insufficient documentation

## 2015-04-22 DIAGNOSIS — M542 Cervicalgia: Secondary | ICD-10-CM | POA: Insufficient documentation

## 2015-04-22 DIAGNOSIS — F419 Anxiety disorder, unspecified: Secondary | ICD-10-CM | POA: Insufficient documentation

## 2015-04-22 DIAGNOSIS — R030 Elevated blood-pressure reading, without diagnosis of hypertension: Secondary | ICD-10-CM | POA: Insufficient documentation

## 2015-04-22 DIAGNOSIS — M545 Low back pain, unspecified: Secondary | ICD-10-CM | POA: Insufficient documentation

## 2015-04-22 DIAGNOSIS — M25569 Pain in unspecified knee: Secondary | ICD-10-CM | POA: Insufficient documentation

## 2015-04-22 DIAGNOSIS — K219 Gastro-esophageal reflux disease without esophagitis: Secondary | ICD-10-CM | POA: Insufficient documentation

## 2015-04-22 DIAGNOSIS — E781 Pure hyperglyceridemia: Secondary | ICD-10-CM | POA: Insufficient documentation

## 2015-04-22 DIAGNOSIS — G5600 Carpal tunnel syndrome, unspecified upper limb: Secondary | ICD-10-CM | POA: Insufficient documentation

## 2015-06-04 ENCOUNTER — Encounter: Payer: Self-pay | Admitting: Emergency Medicine

## 2015-06-04 ENCOUNTER — Emergency Department (INDEPENDENT_AMBULATORY_CARE_PROVIDER_SITE_OTHER)
Admission: EM | Admit: 2015-06-04 | Discharge: 2015-06-04 | Disposition: A | Discharge status: Home / Self Care | Payer: BLUE CROSS/BLUE SHIELD | Source: Home / Self Care | Attending: Family Medicine | Admitting: Family Medicine

## 2015-06-04 ENCOUNTER — Emergency Department (INDEPENDENT_AMBULATORY_CARE_PROVIDER_SITE_OTHER): Payer: BLUE CROSS/BLUE SHIELD

## 2015-06-04 DIAGNOSIS — M25511 Pain in right shoulder: Secondary | ICD-10-CM | POA: Diagnosis not present

## 2015-06-04 DIAGNOSIS — S46001A Unspecified injury of muscle(s) and tendon(s) of the rotator cuff of right shoulder, initial encounter: Secondary | ICD-10-CM

## 2015-06-04 DIAGNOSIS — S4991XA Unspecified injury of right shoulder and upper arm, initial encounter: Secondary | ICD-10-CM | POA: Diagnosis not present

## 2015-06-04 MED ORDER — HYDROCODONE-ACETAMINOPHEN 5-325 MG PO TABS: ORAL_TABLET | ORAL | Status: DC

## 2015-06-04 MED ORDER — MELOXICAM 15 MG PO TABS: 15.0000 mg | ORAL_TABLET | Freq: Every day | ORAL | Status: DC

## 2015-06-04 NOTE — ED Provider Notes (Signed)
CSN: [id number]     Arrival date & time 06/04/15  1123 History   First MD Initiated Contact with Patient 06/04/15 1219     Chief Complaint  Patient presents with  . Shoulder Injury      HPI Comments: Patient reports that he fell off a horse two weeks ago, landing on his right shoulder.  He has had persistent pain and limited range of motion in his right shoulder.  Patient is a 50 y.o. male presenting with shoulder injury. The history is provided by the patient.  Shoulder Injury This is a new problem. Episode onset: 2 weeks ago. The problem occurs constantly. The problem has not changed since onset.Pertinent negatives include no chest pain. Exacerbated by: abduction. Nothing relieves the symptoms. Treatments tried: Ibuprofen and Aleve. The treatment provided no relief.    Past Medical History  Diagnosis Date  . GERD (gastroesophageal reflux disease)   . Depression    Past Surgical History  Procedure Laterality Date  . Knee arthroscopy  94    rt  . Wisdom tooth extraction    . Knee arthroscopy  09/12/2011    Procedure: ARTHROSCOPY KNEE;  Surgeon: Nestor Lewandowsky, MD;  Location: [facility];  Service: Orthopedics;  Laterality: Left;  left knee scope, debride chondromalacia partial medial menisectomy removal loose bodies  . Knee arthroscopy  04/02/2012    Procedure: ARTHROSCOPY KNEE;  Surgeon: Nestor Lewandowsky, MD;  Location: [facility];  Service: Orthopedics;  Laterality: Left;  WITH DEBRIDEMENT OF CHONDROMALACIA    No family history on file. Social History  Substance Use Topics  . Smoking status: Former Smoker    Quit date: 09/08/2003  . Smokeless tobacco: None  . Alcohol Use: Yes     Comment: occ    Review of Systems  Cardiovascular: Negative for chest pain.  All other systems reviewed and are negative.   Allergies  Penicillins  Home Medications   Prior to Admission medications   Medication Sig Start Date End Date Taking? Authorizing Provider   acetaminophen (TYLENOL) 325 MG tablet Take 650 mg by mouth every 6 (six) hours as needed.    Historical Provider, MD  HYDROcodone-acetaminophen (NORCO/VICODIN) 5-325 MG tablet Take one by mouth at bedtime as needed for pain 06/04/15   Brandon Haw, MD  lansoprazole (PREVACID) 30 MG capsule Take 30 mg by mouth daily.    Historical Provider, MD  meloxicam (MOBIC) 15 MG tablet Take 1 tablet (15 mg total) by mouth daily. Take with food each morning 06/04/15   Brandon Haw, MD  venlafaxine XR (EFFEXOR-XR) 75 MG 24 hr capsule Take 75 mg by mouth daily.    Historical Provider, MD   Meds Ordered and Administered this Visit  Medications - No data to display  BP 137/88 mmHg  Pulse 79  Temp(Src) 97.9 F (36.6 C) (Oral)  Ht  (1.854 m)  Wt 215 lb (97.523 kg)  BMI 28.37 kg/m2  SpO2 98% No data found.   Physical Exam  Constitutional: He is oriented to person, place, and time. He appears well-developed and well-nourished. No distress.  HENT:  Head: Atraumatic.  Eyes: Pupils are equal, round, and reactive to light.  Neck: Normal range of motion.  Cardiovascular: Normal heart sounds.   Pulmonary/Chest: Breath sounds normal.  Musculoskeletal:       Right shoulder: He exhibits decreased range of motion, tenderness and decreased strength. He exhibits no bony tenderness, no swelling, no crepitus and no deformity.  Right shoulder has inability to abduct above horizontal, both active and passive.  There is tenderness to palpation over the insertion of the long head of the biceps tendon. Apley's test and Empty can test positive.  Decreased internal/external rotation strength and range of motion.  Hawkin's test positive.  Distal neurovascular function is intact.   Neurological: He is alert and oriented to person, place, and time.  Skin: Skin is warm and dry. No rash noted.  Nursing note and vitals reviewed.   ED Course  Procedures  None   Imaging Review Dg Shoulder Right  06/04/2015   CLINICAL DATA:  Larey SeatFell from horse 2 weeks ago, persistent right shoulder pain, initial encounter EXAM: RIGHT SHOULDER - 2+ VIEW COMPARISON:  None. FINDINGS: There is no evidence of fracture or dislocation. There is no evidence of arthropathy or other focal bone abnormality. Soft tissues are unremarkable. IMPRESSION: Negative. Electronically Signed   By: Natasha MeadLiviu  Pop M.D.   On: 06/04/2015 12:42      MDM   1. Shoulder injury, right, initial encounter   2. Rotator cuff injury, right, initial encounter    Begin Mobic 15mg  daily.  Rx for Lortab at bedtime. Apply ice pack for 20 to 30 minutes, 3 to 4 times daily  Continue until pain decreases.   Begin range of motion and stretching exercises  as per instruction sheet.  Followup with Dr. Rodney Langtonhomas Thekkekandam or Dr. Clementeen GrahamEvan Corey ([facility]) as soon as possible.     Brandon HawStephen A Lorain Fettes, MD 06/11/15 1150

## 2015-06-04 NOTE — Discharge Instructions (Signed)
Apply ice pack for 20 to 30 minutes, 3 to 4 times daily  Continue until pain decreases.   Begin range of motion and stretching exercises  as per instruction sheet.    Rotator Cuff Injury Rotator cuff injury is any type of injury to the set of muscles and tendons that make up the stabilizing unit of your shoulder. This unit holds the ball of your upper arm bone (humerus) in the socket of your shoulder blade (scapula).  CAUSES Injuries to your rotator cuff most commonly come from sports or activities that cause your arm to be moved repeatedly over your head. Examples of this include throwing, weight lifting, swimming, or racquet sports. Long lasting (chronic) irritation of your rotator cuff can cause soreness and swelling (inflammation), bursitis, and eventual damage to your tendons, such as a tear (rupture). SIGNS AND SYMPTOMS Acute rotator cuff tear:  Sudden tearing sensation followed by severe pain shooting from your upper shoulder down your arm toward your elbow.  Decreased range of motion of your shoulder because of pain and muscle spasm.  Severe pain.  Inability to raise your arm out to the side because of pain and loss of muscle power (large tears). Chronic rotator cuff tear:  Pain that usually is worse at night and may interfere with sleep.  Gradual weakness and decreased shoulder motion as the pain worsens.  Decreased range of motion. Rotator cuff tendinitis:  Deep ache in your shoulder and the outside upper arm over your shoulder.  Pain that comes on gradually and becomes worse when lifting your arm to the side or turning it inward. DIAGNOSIS Rotator cuff injury is diagnosed through a medical history, physical exam, and imaging exam. The medical history helps determine the type of rotator cuff injury. Your health care provider will look at your injured shoulder, feel the injured area, and ask you to move your shoulder in different positions. X-ray exams typically are done to  rule out other causes of shoulder pain, such as fractures. MRI is the exam of choice for the most severe shoulder injuries because the images show muscles and tendons.  TREATMENT  Chronic tear:  Medicine for pain, such as acetaminophen or ibuprofen.  Physical therapy and range-of-motion exercises may be helpful in maintaining shoulder function and strength.  Steroid injections into your shoulder joint.  Surgical repair of the rotator cuff if the injury does not heal with noninvasive treatment. Acute tear:  Anti-inflammatory medicines such as ibuprofen and naproxen to help reduce pain and swelling.  A sling to help support your arm and rest your rotator cuff muscles. Long-term use of a sling is not advised. It may cause significant stiffening of the shoulder joint.  Surgery may be considered within a few weeks, especially in younger, active people, to return the shoulder to full function.  Indications for surgical treatment include the following:  Age younger than 60 years.  Rotator cuff tears that are complete.  Physical therapy, rest, and anti-inflammatory medicines have been used for 6-8 weeks, with no improvement.  Employment or sporting activity that requires constant shoulder use. Tendinitis:  Anti-inflammatory medicines such as ibuprofen and naproxen to help reduce pain and swelling.  A sling to help support your arm and rest your rotator cuff muscles. Long-term use of a sling is not advised. It may cause significant stiffening of the shoulder joint.  Severe tendinitis may require:  Steroid injections into your shoulder joint.  Physical therapy.  Surgery. HOME CARE INSTRUCTIONS   Apply ice to  your injury:  Put ice in a plastic bag.  Place a towel between your skin and the bag.  Leave the ice on for 20 minutes, 2-3 times a day.  If you have a shoulder immobilizer (sling and straps), wear it until told otherwise by your health care provider.  You may want to  sleep on several pillows or in a recliner at night to lessen swelling and pain.  Only take over-the-counter or prescription medicines for pain, discomfort, or fever as directed by your health care provider.  Do simple hand squeezing exercises with a soft rubber ball to decrease hand swelling. SEEK MEDICAL CARE IF:   Your shoulder pain increases, or new pain or numbness develops in your arm, hand, or fingers.  Your hand or fingers are colder than your other hand. SEEK IMMEDIATE MEDICAL CARE IF:   Your arm, hand, or fingers are numb or tingling.  Your arm, hand, or fingers are increasingly swollen and painful, or they turn white or blue. MAKE SURE YOU:  Understand these instructions.  Will watch your condition.  Will get help right away if you are not doing well or get worse.   This information is not intended to replace advice given to you by your health care provider. Make sure you discuss any questions you have with your health care provider.   Document Released: 02/26/2000 Document Revised: 03/05/2013 Document Reviewed: 10/10/2012 Elsevier Interactive Patient Education [employer]

## 2015-06-04 NOTE — ED Notes (Signed)
Right shoulder injury, thrown by his horse 2 weeks ago landed on right shoulder, pain has increased, he has seen massage therapist and chiropractor.

## 2015-06-04 NOTE — ED Notes (Signed)
Appt with Dr Stacie Acresorey Mon April 3, 10;30 am

## 2015-06-15 ENCOUNTER — Ambulatory Visit (INDEPENDENT_AMBULATORY_CARE_PROVIDER_SITE_OTHER): Payer: BLUE CROSS/BLUE SHIELD | Admitting: Family Medicine

## 2015-06-15 ENCOUNTER — Encounter: Payer: Self-pay | Admitting: Family Medicine

## 2015-06-15 VITALS — BP 156/95 | HR 90 | Wt 214.0 lb

## 2015-06-15 DIAGNOSIS — S46011A Strain of muscle(s) and tendon(s) of the rotator cuff of right shoulder, initial encounter: Secondary | ICD-10-CM

## 2015-06-15 DIAGNOSIS — S46019A Strain of muscle(s) and tendon(s) of the rotator cuff of unspecified shoulder, initial encounter: Secondary | ICD-10-CM | POA: Insufficient documentation

## 2015-06-15 MED ORDER — MELOXICAM 15 MG PO TABS: 15.0000 mg | ORAL_TABLET | Freq: Every day | ORAL | Status: DC

## 2015-06-15 MED ORDER — HYDROCODONE-ACETAMINOPHEN 5-325 MG PO TABS: ORAL_TABLET | ORAL | Status: DC

## 2015-06-15 NOTE — Progress Notes (Signed)
   Subjective:    I'm seeing this patient as a consultation for:  Dr Cathren HarshBeese  CC: Right Shoulder Pain  HPI: Patient was thrown from a horse about 3 weeks ago landing on his right arm. He was developed pain in her shoulder especially with overhead motion reaching back at night. He was seen in urgent care 2 weeks ago where x-rays were negative. He was thought to have a rotator cuff strain was given home exercise program meloxicam and Norco. He notes that he is doing much better but still having pain. He is able to move his arm fully now but continues to have pain especially at night. He takes occasional Norco just at night and uses meloxicam daily. He denies any neck pain or radiating pain weakness or numbness. He did not hit his head. He works as a Games developerdiesel mechanic. His current pain is not limiting his ability to work.  Past medical history, Surgical history, Family history not pertinant except as noted below, Social history, Allergies, and medications have been entered into the medical record, reviewed, and no changes needed.   Review of Systems: No headache, visual changes, nausea, vomiting, diarrhea, constipation, dizziness, abdominal pain, skin rash, fevers, chills, night sweats, weight loss, swollen lymph nodes, body aches, joint swelling, muscle aches, chest pain, shortness of breath, mood changes, visual or auditory hallucinations.   Objective:    Filed Vitals:   06/15/15 1027  BP: 156/95  Pulse: 90   General: Well Developed, well nourished, and in no acute distress.  Neuro/Psych: Alert and oriented x3, extra-ocular muscles intact, able to move all 4 extremities, sensation grossly intact. Skin: Warm and dry, no rashes noted.  Respiratory: Not using accessory muscles, speaking in full sentences, trachea midline.  Cardiovascular: Pulses palpable, no extremity edema. Abdomen: Does not appear distended. MSK: Neck nontender to midline normal flexion extension rotation. Decreased lateral  flexion bilaterally. Neck and trapezius is nontender. Right shoulder normal-appearing nontender. Normal motion with abduction but pain beyond 120. External rotation is normal but pain beyond 30 Internal rotation limited to the lumbar back. Strength is intact at painful with abduction testing. Positive Hawkins and Neer's test. Positive empty cans test. Negative Yergason's and speeds test.  Left shoulder normal-appearing nontender full motion normal strength negative impingement testing.  Pulses capillary refill sensation intact distally.  X-ray left shoulder dated 06/04/2015 reviewed  No results found for this or any previous visit (from the past 24 hour(s)). No results found.  Impression and Recommendations:   This case required medical decision making of moderate complexity.

## 2015-06-15 NOTE — Assessment & Plan Note (Signed)
Symptom and history and exam consistent with rotator cuff strain versus partial thickness tear. Very doubtful for full thickness rotator cuff tear as patient has intact strength and full motion. Discussed options. Plan to wait on MRI at this time and pursue home exercise program. Continue meloxicam during the day and limited Norco at night. Recheck in 2-4 weeks.

## 2015-06-15 NOTE — Patient Instructions (Signed)
Thank you for coming in today. Continue the home exercises.  Return in 2-4 weeks.  Wean off the norco at night asap.   Impingement Syndrome, Rotator Cuff, Bursitis With Rehab Impingement syndrome is a condition that involves inflammation of the tendons of the rotator cuff and the subacromial bursa, that causes pain in the shoulder. The rotator cuff consists of four tendons and muscles that control much of the shoulder and upper arm function. The subacromial bursa is a fluid filled sac that helps reduce friction between the rotator cuff and one of the bones of the shoulder (acromion). Impingement syndrome is usually an overuse injury that causes swelling of the bursa (bursitis), swelling of the tendon (tendonitis), and/or a tear of the tendon (strain). Strains are classified into three categories. Grade 1 strains cause pain, but the tendon is not lengthened. Grade 2 strains include a lengthened ligament, due to the ligament being stretched or partially ruptured. With grade 2 strains there is still function, although the function may be decreased. Grade 3 strains include a complete tear of the tendon or muscle, and function is usually impaired. SYMPTOMS   Pain around the shoulder, often at the outer portion of the upper arm.  Pain that gets worse with shoulder function, especially when reaching overhead or lifting.  Sometimes, aching when not using the arm.  Pain that wakes you up at night.  Sometimes, tenderness, swelling, warmth, or redness over the affected area.  Loss of strength.  Limited motion of the shoulder, especially reaching behind the back (to the back pocket or to unhook bra) or across your body.  Crackling sound (crepitation) when moving the arm.  Biceps tendon pain and inflammation (in the front of the shoulder). Worse when bending the elbow or lifting. CAUSES  Impingement syndrome is often an overuse injury, in which chronic (repetitive) motions cause the tendons or bursa to  become inflamed. A strain occurs when a force is paced on the tendon or muscle that is greater than it can withstand. Common mechanisms of injury include: Stress from sudden increase in duration, frequency, or intensity of training.  Direct hit (trauma) to the shoulder.  Aging, erosion of the tendon with normal use.  Bony bump on shoulder (acromial spur). RISK INCREASES WITH:  Contact sports (football, wrestling, boxing).  Throwing sports (baseball, tennis, volleyball).  Weightlifting and bodybuilding.  Heavy labor.  Previous injury to the rotator cuff, including impingement.  Poor shoulder strength and flexibility.  Failure to warm up properly before activity.  Inadequate protective equipment.  Old age.  Bony bump on shoulder (acromial spur). PREVENTION   Warm up and stretch properly before activity.  Allow for adequate recovery between workouts.  Maintain physical fitness:  Strength, flexibility, and endurance.  Cardiovascular fitness.  Learn and use proper exercise technique. PROGNOSIS  If treated properly, impingement syndrome usually goes away within 6 weeks. Sometimes surgery is required.  RELATED COMPLICATIONS   Longer healing time if not properly treated, or if not given enough time to heal.  Recurring symptoms, that result in a chronic condition.  Shoulder stiffness, frozen shoulder, or loss of motion.  Rotator cuff tendon tear.  Recurring symptoms, especially if activity is resumed too soon, with overuse, with a direct blow, or when using poor technique. TREATMENT  Treatment first involves the use of ice and medicine, to reduce pain and inflammation. The use of strengthening and stretching exercises may help reduce pain with activity. These exercises may be performed at home or with a therapist.  If non-surgical treatment is unsuccessful after more than 6 months, surgery may be advised. After surgery and rehabilitation, activity is usually possible in  3 months.  MEDICATION  If pain medicine is needed, nonsteroidal anti-inflammatory medicines (aspirin and ibuprofen), or other minor pain relievers (acetaminophen), are often advised.  Do not take pain medicine for 7 days before surgery.  Prescription pain relievers may be given, if your caregiver thinks they are needed. Use only as directed and only as much as you need.  Corticosteroid injections may be given by your caregiver. These injections should be reserved for the most serious cases, because they may only be given a certain number of times. HEAT AND COLD  Cold treatment (icing) should be applied for 10 to 15 minutes every 2 to 3 hours for inflammation and pain, and immediately after activity that aggravates your symptoms. Use ice packs or an ice massage.  Heat treatment may be used before performing stretching and strengthening activities prescribed by your caregiver, physical therapist, or athletic trainer. Use a heat pack or a warm water soak. SEEK MEDICAL CARE IF:   Symptoms get worse or do not improve in 4 to 6 weeks, despite treatment.  New, unexplained symptoms develop. (Drugs used in treatment may produce side effects.) EXERCISES  RANGE OF MOTION (ROM) AND STRETCHING EXERCISES - Impingement Syndrome (Rotator Cuff  Tendinitis, Bursitis) These exercises may help you when beginning to rehabilitate your injury. Your symptoms may go away with or without further involvement from your physician, physical therapist or athletic trainer. While completing these exercises, remember:   Restoring tissue flexibility helps normal motion to return to the joints. This allows healthier, less painful movement and activity.  An effective stretch should be held for at least 30 seconds.  A stretch should never be painful. You should only feel a gentle lengthening or release in the stretched tissue. STRETCH - Flexion, Standing  Stand with good posture. With an underhand grip on your right /  left hand, and an overhand grip on the opposite hand, grasp a broomstick or cane so that your hands are a little more than shoulder width apart.  Keeping your right / left elbow straight and shoulder muscles relaxed, push the stick with your opposite hand, to raise your right / left arm in front of your body and then overhead. Raise your arm until you feel a stretch in your right / left shoulder, but before you have increased shoulder pain.  Try to avoid shrugging your right / left shoulder as your arm rises, by keeping your shoulder blade tucked down and toward your mid-back spine. Hold for __________ seconds.  Slowly return to the starting position. Repeat __________ times. Complete this exercise __________ times per day. STRETCH - Abduction, Supine  Lie on your back. With an underhand grip on your right / left hand and an overhand grip on the opposite hand, grasp a broomstick or cane so that your hands are a little more than shoulder width apart.  Keeping your right / left elbow straight and your shoulder muscles relaxed, push the stick with your opposite hand, to raise your right / left arm out to the side of your body and then overhead. Raise your arm until you feel a stretch in your right / left shoulder, but before you have increased shoulder pain.  Try to avoid shrugging your right / left shoulder as your arm rises, by keeping your shoulder blade tucked down and toward your mid-back spine. Hold for __________ seconds.  Slowly return to the starting position. Repeat __________ times. Complete this exercise __________ times per day. ROM - Flexion, Active-Assisted  Lie on your back. You may bend your knees for comfort.  Grasp a broomstick or cane so your hands are about shoulder width apart. Your right / left hand should grip the end of the stick, so that your hand is positioned "thumbs-up," as if you were about to shake hands.  Using your healthy arm to lead, raise your right / left arm  overhead, until you feel a gentle stretch in your shoulder. Hold for __________ seconds.  Use the stick to assist in returning your right / left arm to its starting position. Repeat __________ times. Complete this exercise __________ times per day.  ROM - Internal Rotation, Supine   Lie on your back on a firm surface. Place your right / left elbow about 60 degrees away from your side. Elevate your elbow with a folded towel, so that the elbow and shoulder are the same height.  Using a broomstick or cane and your strong arm, pull your right / left hand toward your body until you feel a gentle stretch, but no increase in your shoulder pain. Keep your shoulder and elbow in place throughout the exercise.  Hold for __________ seconds. Slowly return to the starting position. Repeat __________ times. Complete this exercise __________ times per day. STRETCH - Internal Rotation  Place your right / left hand behind your back, palm up.  Throw a towel or belt over your opposite shoulder. Grasp the towel with your right / left hand.  While keeping an upright posture, gently pull up on the towel, until you feel a stretch in the front of your right / left shoulder.  Avoid shrugging your right / left shoulder as your arm rises, by keeping your shoulder blade tucked down and toward your mid-back spine.  Hold for __________ seconds. Release the stretch, by lowering your healthy hand. Repeat __________ times. Complete this exercise __________ times per day. ROM - Internal Rotation   Using an underhand grip, grasp a stick behind your back with both hands.  While standing upright with good posture, slide the stick up your back until you feel a mild stretch in the front of your shoulder.  Hold for __________ seconds. Slowly return to your starting position. Repeat __________ times. Complete this exercise __________ times per day.  STRETCH - Posterior Shoulder Capsule   Stand or sit with good posture.  Grasp your right / left elbow and draw it across your chest, keeping it at the same height as your shoulder.  Pull your elbow, so your upper arm comes in closer to your chest. Pull until you feel a gentle stretch in the back of your shoulder.  Hold for __________ seconds. Repeat __________ times. Complete this exercise __________ times per day. STRENGTHENING EXERCISES - Impingement Syndrome (Rotator Cuff Tendinitis, Bursitis) These exercises may help you when beginning to rehabilitate your injury. They may resolve your symptoms with or without further involvement from your physician, physical therapist or athletic trainer. While completing these exercises, remember:  Muscles can gain both the endurance and the strength needed for everyday activities through controlled exercises.  Complete these exercises as instructed by your physician, physical therapist or athletic trainer. Increase the resistance and repetitions only as guided.  You may experience muscle soreness or fatigue, but the pain or discomfort you are trying to eliminate should never worsen during these exercises. If this pain does get worse,  stop and make sure you are following the directions exactly. If the pain is still present after adjustments, discontinue the exercise until you can discuss the trouble with your clinician.  During your recovery, avoid activity or exercises which involve actions that place your injured hand or elbow above your head or behind your back or head. These positions stress the tissues which you are trying to heal. STRENGTH - Scapular Depression and Adduction   With good posture, sit on a firm chair. Support your arms in front of you, with pillows, arm rests, or on a table top. Have your elbows in line with the sides of your body.  Gently draw your shoulder blades down and toward your mid-back spine. Gradually increase the tension, without tensing the muscles along the top of your shoulders and the back of  your neck.  Hold for __________ seconds. Slowly release the tension and relax your muscles completely before starting the next repetition.  After you have practiced this exercise, remove the arm support and complete the exercise in standing as well as sitting position. Repeat __________ times. Complete this exercise __________ times per day.  STRENGTH - Shoulder Abductors, Isometric  With good posture, stand or sit about 4-6 inches from a wall, with your right / left side facing the wall.  Bend your right / left elbow. Gently press your right / left elbow into the wall. Increase the pressure gradually, until you are pressing as hard as you can, without shrugging your shoulder or increasing any shoulder discomfort.  Hold for __________ seconds.  Release the tension slowly. Relax your shoulder muscles completely before you begin the next repetition. Repeat __________ times. Complete this exercise __________ times per day.  STRENGTH - External Rotators, Isometric  Keep your right / left elbow at your side and bend it 90 degrees.  Step into a door frame so that the outside of your right / left wrist can press against the door frame without your upper arm leaving your side.  Gently press your right / left wrist into the door frame, as if you were trying to swing the back of your hand away from your stomach. Gradually increase the tension, until you are pressing as hard as you can, without shrugging your shoulder or increasing any shoulder discomfort.  Hold for __________ seconds.  Release the tension slowly. Relax your shoulder muscles completely before you begin the next repetition. Repeat __________ times. Complete this exercise __________ times per day.  STRENGTH - Supraspinatus   Stand or sit with good posture. Grasp a __________ weight, or an exercise band or tubing, so that your hand is "thumbs-up," like you are shaking hands.  Slowly lift your right / left arm in a "V" away from  your thigh, diagonally into the space between your side and straight ahead. Lift your hand to shoulder height or as far as you can, without increasing any shoulder pain. At first, many people do not lift their hands above shoulder height.  Avoid shrugging your right / left shoulder as your arm rises, by keeping your shoulder blade tucked down and toward your mid-back spine.  Hold for __________ seconds. Control the descent of your hand, as you slowly return to your starting position. Repeat __________ times. Complete this exercise __________ times per day.  STRENGTH - External Rotators  Secure a rubber exercise band or tubing to a fixed object (table, pole) so that it is at the same height as your right / left elbow when you  are standing or sitting on a firm surface.  Stand or sit so that the secured exercise band is at your uninjured side.  Bend your right / left elbow 90 degrees. Place a folded towel or small pillow under your right / left arm, so that your elbow is a few inches away from your side.  Keeping the tension on the exercise band, pull it away from your body, as if pivoting on your elbow. Be sure to keep your body steady, so that the movement is coming only from your rotating shoulder.  Hold for __________ seconds. Release the tension in a controlled manner, as you return to the starting position. Repeat __________ times. Complete this exercise __________ times per day.  STRENGTH - Internal Rotators   Secure a rubber exercise band or tubing to a fixed object (table, pole) so that it is at the same height as your right / left elbow when you are standing or sitting on a firm surface.  Stand or sit so that the secured exercise band is at your right / left side.  Bend your elbow 90 degrees. Place a folded towel or small pillow under your right / left arm so that your elbow is a few inches away from your side.  Keeping the tension on the exercise band, pull it across your body,  toward your stomach. Be sure to keep your body steady, so that the movement is coming only from your rotating shoulder.  Hold for __________ seconds. Release the tension in a controlled manner, as you return to the starting position. Repeat __________ times. Complete this exercise __________ times per day.  STRENGTH - Scapular Protractors, Standing   Stand arms length away from a wall. Place your hands on the wall, keeping your elbows straight.  Begin by dropping your shoulder blades down and toward your mid-back spine.  To strengthen your protractors, keep your shoulder blades down, but slide them forward on your rib cage. It will feel as if you are lifting the back of your rib cage away from the wall. This is a subtle motion and can be challenging to complete. Ask your caregiver for further instruction, if you are not sure you are doing the exercise correctly.  Hold for __________ seconds. Slowly return to the starting position, resting the muscles completely before starting the next repetition. Repeat __________ times. Complete this exercise __________ times per day. STRENGTH - Scapular Protractors, Supine  Lie on your back on a firm surface. Extend your right / left arm straight into the air while holding a __________ weight in your hand.  Keeping your head and back in place, lift your shoulder off the floor.  Hold for __________ seconds. Slowly return to the starting position, and allow your muscles to relax completely before starting the next repetition. Repeat __________ times. Complete this exercise __________ times per day. STRENGTH - Scapular Protractors, Quadruped  Get onto your hands and knees, with your shoulders directly over your hands (or as close as you can be, comfortably).  Keeping your elbows locked, lift the back of your rib cage up into your shoulder blades, so your mid-back rounds out. Keep your neck muscles relaxed.  Hold this position for __________ seconds.  Slowly return to the starting position and allow your muscles to relax completely before starting the next repetition. Repeat __________ times. Complete this exercise __________ times per day.  STRENGTH - Scapular Retractors  Secure a rubber exercise band or tubing to a fixed object (table, pole),  so that it is at the height of your shoulders when you are either standing, or sitting on a firm armless chair.  With a palm down grip, grasp an end of the band in each hand. Straighten your elbows and lift your hands straight in front of you, at shoulder height. Step back, away from the secured end of the band, until it becomes tense.  Squeezing your shoulder blades together, draw your elbows back toward your sides, as you bend them. Keep your upper arms lifted away from your body throughout the exercise.  Hold for __________ seconds. Slowly ease the tension on the band, as you reverse the directions and return to the starting position. Repeat __________ times. Complete this exercise __________ times per day. STRENGTH - Shoulder Extensors   Secure a rubber exercise band or tubing to a fixed object (table, pole) so that it is at the height of your shoulders when you are either standing, or sitting on a firm armless chair.  With a thumbs-up grip, grasp an end of the band in each hand. Straighten your elbows and lift your hands straight in front of you, at shoulder height. Step back, away from the secured end of the band, until it becomes tense.  Squeezing your shoulder blades together, pull your hands down to the sides of your thighs. Do not allow your hands to go behind you.  Hold for __________ seconds. Slowly ease the tension on the band, as you reverse the directions and return to the starting position. Repeat __________ times. Complete this exercise __________ times per day.  STRENGTH - Scapular Retractors and External Rotators   Secure a rubber exercise band or tubing to a fixed object (table,  pole) so that it is at the height as your shoulders, when you are either standing, or sitting on a firm armless chair.  With a palm down grip, grasp an end of the band in each hand. Bend your elbows 90 degrees and lift your elbows to shoulder height, at your sides. Step back, away from the secured end of the band, until it becomes tense.  Squeezing your shoulder blades together, rotate your shoulders so that your upper arms and elbows remain stationary, but your fists travel upward to head height.  Hold for __________ seconds. Slowly ease the tension on the band, as you reverse the directions and return to the starting position. Repeat __________ times. Complete this exercise __________ times per day.  STRENGTH - Scapular Retractors and External Rotators, Rowing   Secure a rubber exercise band or tubing to a fixed object (table, pole) so that it is at the height of your shoulders, when you are either standing, or sitting on a firm armless chair.  With a palm down grip, grasp an end of the band in each hand. Straighten your elbows and lift your hands straight in front of you, at shoulder height. Step back, away from the secured end of the band, until it becomes tense.  Step 1: Squeeze your shoulder blades together. Bending your elbows, draw your hands to your chest, as if you are rowing a boat. At the end of this motion, your hands and elbow should be at shoulder height and your elbows should be out to your sides.  Step 2: Rotate your shoulders, to raise your hands above your head. Your forearms should be vertical and your upper arms should be horizontal.  Hold for __________ seconds. Slowly ease the tension on the band, as you reverse the directions and  return to the starting position. Repeat __________ times. Complete this exercise __________ times per day.  STRENGTH - Scapular Depressors  Find a sturdy chair without wheels, such as a dining room chair.  Keeping your feet on the floor, and  your hands on the chair arms, lift your bottom up from the seat, and lock your elbows.  Keeping your elbows straight, allow gravity to pull your body weight down. Your shoulders will rise toward your ears.  Raise your body against gravity by drawing your shoulder blades down your back, shortening the distance between your shoulders and ears. Although your feet should always maintain contact with the floor, your feet should progressively support less body weight, as you get stronger.  Hold for __________ seconds. In a controlled and slow manner, lower your body weight to begin the next repetition. Repeat __________ times. Complete this exercise __________ times per day.    This information is not intended to replace advice given to you by your health care provider. Make sure you discuss any questions you have with your health care provider.   Document Released: 02/28/2005 Document Revised: 03/21/2014 Document Reviewed: 06/12/2008 Elsevier Interactive Patient Education [employer].

## 2015-06-29 ENCOUNTER — Ambulatory Visit (INDEPENDENT_AMBULATORY_CARE_PROVIDER_SITE_OTHER): Payer: BLUE CROSS/BLUE SHIELD | Admitting: Family Medicine

## 2015-06-29 DIAGNOSIS — Z5329 Procedure and treatment not carried out because of patient's decision for other reasons: Secondary | ICD-10-CM

## 2015-06-29 NOTE — Progress Notes (Signed)
No show. Return PRN

## 2016-11-24 ENCOUNTER — Emergency Department
Admission: EM | Admit: 2016-11-24 | Discharge: 2016-11-24 | Disposition: A | Discharge status: Home / Self Care | Payer: BLUE CROSS/BLUE SHIELD | Source: Home / Self Care | Attending: Family Medicine | Admitting: Family Medicine

## 2016-11-24 DIAGNOSIS — M436 Torticollis: Secondary | ICD-10-CM | POA: Diagnosis not present

## 2016-11-24 DIAGNOSIS — M542 Cervicalgia: Secondary | ICD-10-CM

## 2016-11-24 DIAGNOSIS — M62838 Other muscle spasm: Secondary | ICD-10-CM

## 2016-11-24 MED ORDER — METHYLPREDNISOLONE SODIUM SUCC 40 MG IJ SOLR
80.0000 mg | Freq: Once | INTRAMUSCULAR | Status: AC
  Administered 2016-11-24: 80 mg via INTRAMUSCULAR

## 2016-11-24 MED ORDER — HYDROCODONE-ACETAMINOPHEN 5-325 MG PO TABS: ORAL_TABLET | ORAL | 0 refills | Status: DC

## 2016-11-24 NOTE — Discharge Instructions (Signed)
°  Norco/Vicodin (hydrocodone-acetaminophen) is a narcotic pain medication, do not combine these medications with others containing tylenol. While taking, do not drink alcohol, drive, or perform any other activities that requires focus while taking these medications.   You may take  acetaminophen every 4-6 hours (during the day when not taking Norco) or in combination with ibuprofen 400-600mg  every 6-8 hours as needed for pain and inflammation.
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
## 2016-11-24 NOTE — ED Provider Notes (Signed)
[facility]    CSN: [id number] Arrival date & time: 11/24/16  8295     History   Chief Complaint Chief Complaint  Patient presents with  . Neck Pain  . Shoulder Pain    HPI Brandon Fitzpatrick is a 51 y.o. male.   HPI  Brandon Fitzpatrick is a 51 y.o. male presenting to UC with c/o 2 weeks of gradually worsening Left sided neck and shoulder pain with numbness and tingling down his Left arm the last 2 days.  He has had similar intermittent neck pain and stiffness for 15 years but states it typically goes away within a few days.  No specific known injury.  He notes he bent down to tie his shoe when current symptoms started.  He saw his PCP in June 2018 for the same symptoms. He was given referral to PT but states pain resolved so he did not go to PT.  He has been to the chiropractor and had a massage but states that only made symptoms worse.  He has been taking Robaxin and ibuprofen w/o relief.     Past Medical History:  Diagnosis Date  . Depression   . GERD (gastroesophageal reflux disease)     Patient Active Problem List   Diagnosis Date Noted  . Rotator cuff strain 06/15/2015  . Anxiety 04/22/2015  . Carpal tunnel syndrome 04/22/2015  . Cervical pain 04/22/2015  . Blood pressure elevated without history of HTN 04/22/2015  . Acid reflux 04/22/2015  . LBP (low back pain) 04/22/2015  . Gonalgia 04/22/2015  . Plantar fasciitis 04/22/2015  . Pure hyperglyceridemia 04/22/2015    Past Surgical History:  Procedure Laterality Date  . KNEE ARTHROSCOPY  94   rt  . KNEE ARTHROSCOPY  09/12/2011   Procedure: ARTHROSCOPY KNEE;  Surgeon: Nestor Lewandowsky, MD;  Location: [facility];  Service: Orthopedics;  Laterality: Left;  left knee scope, debride chondromalacia partial medial menisectomy removal loose bodies  . KNEE ARTHROSCOPY  04/02/2012   Procedure: ARTHROSCOPY KNEE;  Surgeon: Nestor Lewandowsky, MD;  Location: [facility];  Service: Orthopedics;   Laterality: Left;  WITH DEBRIDEMENT OF CHONDROMALACIA   . WISDOM TOOTH EXTRACTION         Home Medications    Prior to Admission medications   Medication Sig Start Date End Date Taking? Authorizing Provider  cyclobenzaprine (FLEXERIL) 10 MG tablet Take 10 mg by mouth.    [provider]  etodolac (LODINE XL) 600 MG 24 hr tablet Take 600 mg by mouth.    [provider]  fluticasone (FLONASE) 50 MCG/ACT nasal spray Place into the nose. 04/22/15   [provider]  HYDROcodone-acetaminophen (NORCO/VICODIN) 5-325 MG tablet Take 1 tab every 4-6 hours as needed for severe pain 11/24/16   Lurene Shadow, PA-C  lansoprazole (PREVACID) 30 MG capsule Take 30 mg by mouth daily.    [provider]  meloxicam (MOBIC) 15 MG tablet Take 1 tablet (15 mg total) by mouth daily. Take with food each morning 06/15/15   Rodolph Bong, MD  methocarbamol (ROBAXIN) 750 MG tablet Take 750 mg by mouth.    [provider]  venlafaxine XR (EFFEXOR-XR) 75 MG 24 hr capsule Take 75 mg by mouth daily.    [provider]    Family History History reviewed. No pertinent family history.  Social History Social History  Substance Use Topics  . Smoking status: Former Smoker    Quit date:  09/08/2003  . Smokeless tobacco: Not on file  . Alcohol use Yes     Comment: occ     Allergies   Prednisone and Penicillins   Review of Systems Review of Systems  Constitutional: Negative for chills and fever.  Musculoskeletal: Positive for back pain, myalgias, neck pain and neck stiffness. Negative for arthralgias.  Skin: Negative for color change and wound.  Neurological: Positive for numbness (intermittent tingling in  Left arm). Negative for weakness.     Physical Exam Triage Vital Signs ED Triage Vitals  Enc Vitals Group     BP 11/24/16 0950 129/87     Pulse Rate 11/24/16 0950 99     Resp --      Temp 11/24/16 0950 97.8 F (36.6 C)     Temp Source 11/24/16 0950  Oral     SpO2 11/24/16 0950 98 %     Weight 11/24/16 0951 214 lb (97.1 kg)     Height 11/24/16 0951  (1.854 m)     Head Circumference --      Peak Flow --      Pain Score 11/24/16 0951 10     Pain Loc --      Pain Edu? --      Excl. in GC? --    No data found.   Updated Vital Signs BP 129/87 (BP Location: Left Arm)   Pulse 99   Temp 97.8 F (36.6 C) (Oral)   Ht  (1.854 m)   Wt 214 lb (97.1 kg)   SpO2 98%   BMI 28.23 kg/m   Visual Acuity Right Eye Distance:   Left Eye Distance:   Bilateral Distance:    Right Eye Near:   Left Eye Near:    Bilateral Near:     Physical Exam  Constitutional: He is oriented to person, place, and time. He appears well-developed and well-nourished. No distress.  HENT:  Head: Normocephalic and atraumatic.  Eyes: EOM are normal.  Neck: Normal range of motion. Neck supple.  No midline spinal tenderness. Slight head rotation to the Left. Limited flexion and extension due to pain and stiffness. Tenderness to Left side cervical muscles.  Cardiovascular: Normal rate.   Pulmonary/Chest: Effort normal.  Musculoskeletal: Normal range of motion. He exhibits tenderness. He exhibits no edema.  No midline spinal tenderness. Palpable muscle spasm in Left upper trapezius. Tenderness to bilateral upper thoracic paraspinal muscles. Full ROM upper and lower extremities with 5/5 strength.   Neurological: He is alert and oriented to person, place, and time.  Skin: Skin is warm and dry. Capillary refill takes less than 2 seconds. No rash noted. He is not diaphoretic. No erythema.  Psychiatric: He has a normal mood and affect. His behavior is normal.  Nursing note and vitals reviewed.    UC Treatments / Results  Labs (all labs ordered are listed, but only abnormal results are displayed) Labs Reviewed - No data to display  EKG  EKG Interpretation None       Radiology No results found.  Procedures Procedures (including critical care  time)  Medications Ordered in UC Medications  methylPREDNISolone sodium succinate (SOLU-MEDROL) 40 mg/mL injection 80 mg (80 mg Intramuscular Given 11/24/16 1002)     Initial Impression / Assessment and Plan / UC Course  I have reviewed the triage vital signs and the nursing notes.  Pertinent labs & imaging results that were available during my care of the patient were reviewed by me and considered in  my medical decision making (see chart for details).     Hx and exam c/w acute on chronic Left side neck and shoulder pain secondary to muscle spasm.  Solumedrol 80mg  IM given in UC Norco prescribed for pain (tramadol may interact with his Effexor)  Encouraged f/u with his PCP and/or Sports Medicine for referral to PT or neurosurgeon (pt reports being told he has a bone spur in neck in the past).   Final Clinical Impressions(s) / UC Diagnoses   Final diagnoses:  Left torticollis  Trapezius muscle spasm  Neck pain on left side    New Prescriptions Discharge Medication List as of 11/24/2016 10:07 AM       Controlled Substance Prescriptions [place] Controlled Substance Registry consulted? Yes, I have consulted the [place] Controlled Substances Registry for this patient, and feel the risk/benefit ratio today is favorable for proceeding with this prescription for a controlled substance.   Lurene Shadowhelps, Fed Ceci O, [place]PA-C 11/24/16 1028

## 2016-11-26 ENCOUNTER — Telehealth: Payer: Self-pay | Admitting: Emergency Medicine

## 2016-11-26 NOTE — Telephone Encounter (Signed)
Patient states no improvement in pain; this has happened before; he will go to pcp for follow up.

## 2017-01-17 ENCOUNTER — Other Ambulatory Visit: Payer: Self-pay | Admitting: Gerontology

## 2017-01-17 ENCOUNTER — Ambulatory Visit (INDEPENDENT_AMBULATORY_CARE_PROVIDER_SITE_OTHER): Payer: Self-pay

## 2017-01-17 DIAGNOSIS — M25562 Pain in left knee: Secondary | ICD-10-CM

## 2017-01-17 DIAGNOSIS — R52 Pain, unspecified: Secondary | ICD-10-CM

## 2017-08-17 ENCOUNTER — Encounter: Payer: Self-pay | Admitting: Emergency Medicine

## 2017-08-17 ENCOUNTER — Other Ambulatory Visit: Payer: Self-pay

## 2017-08-17 ENCOUNTER — Emergency Department (INDEPENDENT_AMBULATORY_CARE_PROVIDER_SITE_OTHER): Payer: BLUE CROSS/BLUE SHIELD

## 2017-08-17 ENCOUNTER — Emergency Department
Admission: EM | Admit: 2017-08-17 | Discharge: 2017-08-17 | Disposition: A | Discharge status: Home / Self Care | Payer: BLUE CROSS/BLUE SHIELD | Source: Home / Self Care | Attending: Family Medicine | Admitting: Family Medicine

## 2017-08-17 DIAGNOSIS — M25572 Pain in left ankle and joints of left foot: Secondary | ICD-10-CM

## 2017-08-17 DIAGNOSIS — M79672 Pain in left foot: Secondary | ICD-10-CM | POA: Diagnosis not present

## 2017-08-17 DIAGNOSIS — S82892A Other fracture of left lower leg, initial encounter for closed fracture: Secondary | ICD-10-CM | POA: Diagnosis not present

## 2017-08-17 DIAGNOSIS — M25472 Effusion, left ankle: Secondary | ICD-10-CM | POA: Diagnosis not present

## 2017-08-17 MED ORDER — HYDROCODONE-ACETAMINOPHEN 5-325 MG PO TABS: 1.0000 | ORAL_TABLET | Freq: Four times a day (QID) | ORAL | 0 refills | Status: DC | PRN

## 2017-08-17 NOTE — Discharge Instructions (Signed)
°  You may take 500mg  acetaminophen every 4-6 hours or in combination with ibuprofen 400-600mg  every 6-8 hours as needed for pain and inflammation.  Norco/Vicodin (hydrocodone-acetaminophen) is a narcotic pain medication, do not combine these medications with others containing tylenol. While taking, do not drink alcohol, drive, or perform any other activities that requires focus while taking these medications.   Please follow up with Sports Medicine in 1-2 weeks to ensure proper healing and for ongoing treatment of avulsion fracture of distal fibula bone.

## 2017-08-17 NOTE — ED Provider Notes (Signed)
[facility]    CSN: [id number] Arrival date & time: 08/17/17  1332     History   Chief Complaint Chief Complaint  Patient presents with  . Ankle Pain    LEFT    HPI Brandon Fitzpatrick is a 52 y.o. male.   HPI  Brandon Fitzpatrick is a 52 y.o. male presenting to UC with c/o sudden onset Left ankle pain and swelling that occurred about 10 minutes PTA. Pt stepped in a hole while getting out of his truck, rolling his ankle.  Pain is aching and throbbing 8/10. Pain and swelling is mostly on the lateral aspect of his ankle and foot but radiates into the back of his ankle with weightbearing. He took Tylenol at that time.  No other injuries. No prior injury to same ankle or foot.   Past Medical History:  Diagnosis Date  . Depression   . GERD (gastroesophageal reflux disease)     Patient Active Problem List   Diagnosis Date Noted  . Rotator cuff strain 06/15/2015  . Anxiety 04/22/2015  . Carpal tunnel syndrome 04/22/2015  . Cervical pain 04/22/2015  . Blood pressure elevated without history of HTN 04/22/2015  . Acid reflux 04/22/2015  . LBP (low back pain) 04/22/2015  . Gonalgia 04/22/2015  . Plantar fasciitis 04/22/2015  . Pure hyperglyceridemia 04/22/2015    Past Surgical History:  Procedure Laterality Date  . KNEE ARTHROSCOPY  94   rt  . KNEE ARTHROSCOPY  09/12/2011   Procedure: ARTHROSCOPY KNEE;  Surgeon: Nestor LewandowskyFrank J Rowan, MD;  Location: [facility];  Service: Orthopedics;  Laterality: Left;  left knee scope, debride chondromalacia partial medial menisectomy removal loose bodies  . KNEE ARTHROSCOPY  04/02/2012   Procedure: ARTHROSCOPY KNEE;  Surgeon: Nestor LewandowskyFrank J Rowan, MD;  Location:  [facility];  Service: Orthopedics;  Laterality: Left;  WITH DEBRIDEMENT OF CHONDROMALACIA   . WISDOM TOOTH EXTRACTION         Home Medications    Prior to Admission medications   Medication Sig Start Date End Date Taking? Authorizing Provider    cyclobenzaprine (FLEXERIL) 10 MG tablet Take 10 mg by mouth.    [provider]  etodolac (LODINE XL) 600 MG 24 hr tablet Take 600 mg by mouth.    [provider]  fluticasone (FLONASE) 50 MCG/ACT nasal spray Place into the nose. 04/22/15   [provider]  HYDROcodone-acetaminophen (NORCO/VICODIN) 5-325 MG tablet Take 1 tablet by mouth every 6 (six) hours as needed for moderate pain or severe pain. 08/17/17   Lurene ShadowPhelps, Annie Saephan O, PA-C  lansoprazole (PREVACID) 30 MG capsule Take 30 mg by mouth daily.    [provider]  meloxicam (MOBIC) 15 MG tablet Take 1 tablet (15 mg total) by mouth daily. Take with food each morning 06/15/15   Rodolph Bongorey, Evan S, MD  methocarbamol (ROBAXIN) 750 MG tablet Take 750 mg by mouth.    [provider]  venlafaxine XR (EFFEXOR-XR) 75 MG 24 hr capsule Take 75 mg by mouth daily.    [provider]    Family History History reviewed. No pertinent family history.  Social History Social History   Tobacco Use  . Smoking status: Former Smoker    Last attempt to quit: 09/08/2003    Years since quitting: 13.9  . Smokeless tobacco: Never Used  Substance Use Topics  . Alcohol use: Yes    Comment: occ  . Drug use: No     Allergies  Prednisone and Penicillins   Review of Systems Review of Systems  Musculoskeletal: Positive for arthralgias and joint swelling. Negative for myalgias.  Skin: Negative for color change and wound.  Neurological: Negative for weakness and numbness.     Physical Exam Triage Vital Signs ED Triage Vitals  Enc Vitals Group     BP      Pulse      Resp      Temp      Temp src      SpO2      Weight      Height      Head Circumference      Peak Flow      Pain Score      Pain Loc      Pain Edu?      Excl. in GC?    No data found.  Updated Vital Signs BP (!) 156/93 (BP Location: Left Arm)   Pulse 96   Temp (!) 97.5 F (36.4 C) (Oral)   Ht 6\' 1"  (1.854 m)   Wt 215 lb (97.5 kg)    SpO2 99%   BMI 28.37 kg/m   Visual Acuity Right Eye Distance:   Left Eye Distance:   Bilateral Distance:    Right Eye Near:   Left Eye Near:    Bilateral Near:     Physical Exam  Constitutional: He is oriented to person, place, and time. He appears well-developed and well-nourished.  HENT:  Head: Normocephalic and atraumatic.  Eyes: EOM are normal.  Neck: Normal range of motion.  Cardiovascular: Normal rate.  Pulses:      Dorsalis pedis pulses are 2+ on the left side.       Posterior tibial pulses are 2+ on the left side.  Pulmonary/Chest: Effort normal.  Musculoskeletal: He exhibits edema and tenderness.  Left ankle: moderate to significant swelling to lateral aspect. Tender.  Left foot: mild tenderness to proximal dorsal and lateral aspect. Limited ROM due to pain Calf is soft, non-tender. Full ROM Left knee  Neurological: He is alert and oriented to person, place, and time.  Skin: Skin is warm and dry. Capillary refill takes less than 2 seconds.  Left ankle and foot: skin in tact. No ecchymosis or erythema.   Psychiatric: He has a normal mood and affect. His behavior is normal.  Nursing note and vitals reviewed.    UC Treatments / Results  Labs (all labs ordered are listed, but only abnormal results are displayed) Labs Reviewed - No data to display  EKG None  Radiology Dg Ankle Complete Left  Result Date: 08/17/2017 CLINICAL DATA:  Acute LEFT ankle pain following fall. Initial encounter. EXAM: LEFT ANKLE COMPLETE - 3+ VIEW COMPARISON:  None. FINDINGS: A small bony density along the tip of the distal fibula is indeterminate. This does not have a typical feature for an acute fracture, which is not entirely excluded. LATERAL soft tissue swelling is noted. No other fracture, subluxation or dislocation identified. The joint spaces are unremarkable. IMPRESSION: Tiny indeterminate bony density along the tip of the distal fibula. A small avulsion fracture is not entirely  excluded. LATERAL soft tissue swelling. Electronically Signed   By: Harmon Pier M.D.   On: 08/17/2017 14:17   Dg Foot Complete Left  Result Date: 08/17/2017 CLINICAL DATA:  Acute LEFT foot pain following injury today. Initial encounter. EXAM: LEFT FOOT - COMPLETE 3+ VIEW COMPARISON:  None. FINDINGS: No evidence of acute fracture, subluxation or dislocation. LATERAL soft tissue  swelling noted. Joint space is unremarkable. No radiopaque foreign bodies. IMPRESSION: .  Soft tissue swelling without acute bony abnormality Electronically Signed   By: Harmon Pier M.D.   On: 08/17/2017 14:18    Procedures Procedures (including critical care time)  Medications Ordered in UC Medications - No data to display  Initial Impression / Assessment and Plan / UC Course  I have reviewed the triage vital signs and the nursing notes.  Pertinent labs & imaging results that were available during my care of the patient were reviewed by me and considered in my medical decision making (see chart for details).     Hx and exam c/w closed avulsion fracture of Left ankle Due to swelling, unable to apply a boot.  Ace wrap applied for comfort. Boot provided for to use once swelling improves. Pt has crutches at home.  Final Clinical Impressions(s) / UC Diagnoses   Final diagnoses:  Avulsion fracture of left ankle, closed, initial encounter     Discharge Instructions      You may take 500mg  acetaminophen every 4-6 hours or in combination with ibuprofen 400-600mg  every 6-8 hours as needed for pain and inflammation.  Norco/Vicodin (hydrocodone-acetaminophen) is a narcotic pain medication, do not combine these medications with others containing tylenol. While taking, do not drink alcohol, drive, or perform any other activities that requires focus while taking these medications.   Please follow up with Sports Medicine in 1-2 weeks to ensure proper healing and for ongoing treatment of avulsion fracture of distal fibula  bone.      ED Prescriptions    Medication Sig Dispense Auth. Provider   HYDROcodone-acetaminophen (NORCO/VICODIN) 5-325 MG tablet Take 1 tablet by mouth every 6 (six) hours as needed for moderate pain or severe pain. 10 tablet Lurene Shadow, PA-C     Controlled Substance Prescriptions [place] Controlled Substance Registry consulted? Yes, I have consulted the [place] Controlled Substances Registry for this patient, and feel the risk/benefit ratio today is favorable for proceeding with this prescription for a controlled substance.   Lurene Shadow, PA-C 08/17/17 1501

## 2017-08-17 NOTE — ED Triage Notes (Signed)
52 y.o male presents to the clinic today with left ankle pain. States he was on lunch break from work when he stepped out of his truck and into a hole. He complains of pain. There is swelling to the area. He states at approx. 1:25pm he took Tylenol.

## 2018-11-04 IMAGING — DX DG FOOT COMPLETE 3+V*L*
3 series · 3 of 3 positions shown · non-contrast
Comparison: None.

CLINICAL DATA: Acute LEFT foot pain following injury today. Initial
encounter.

EXAM:
LEFT FOOT - COMPLETE 3+ VIEW

[foot ap]
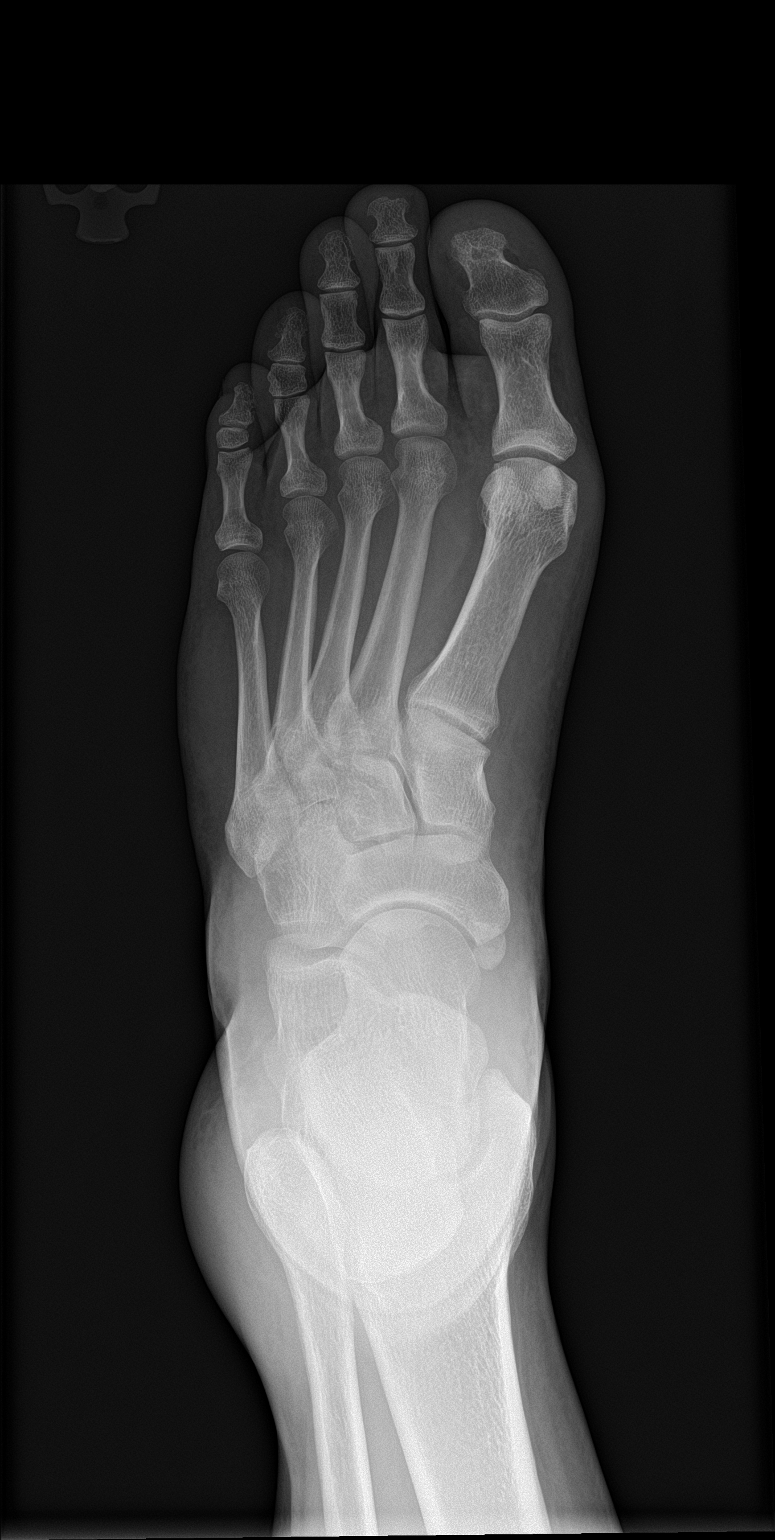

[foot obl]
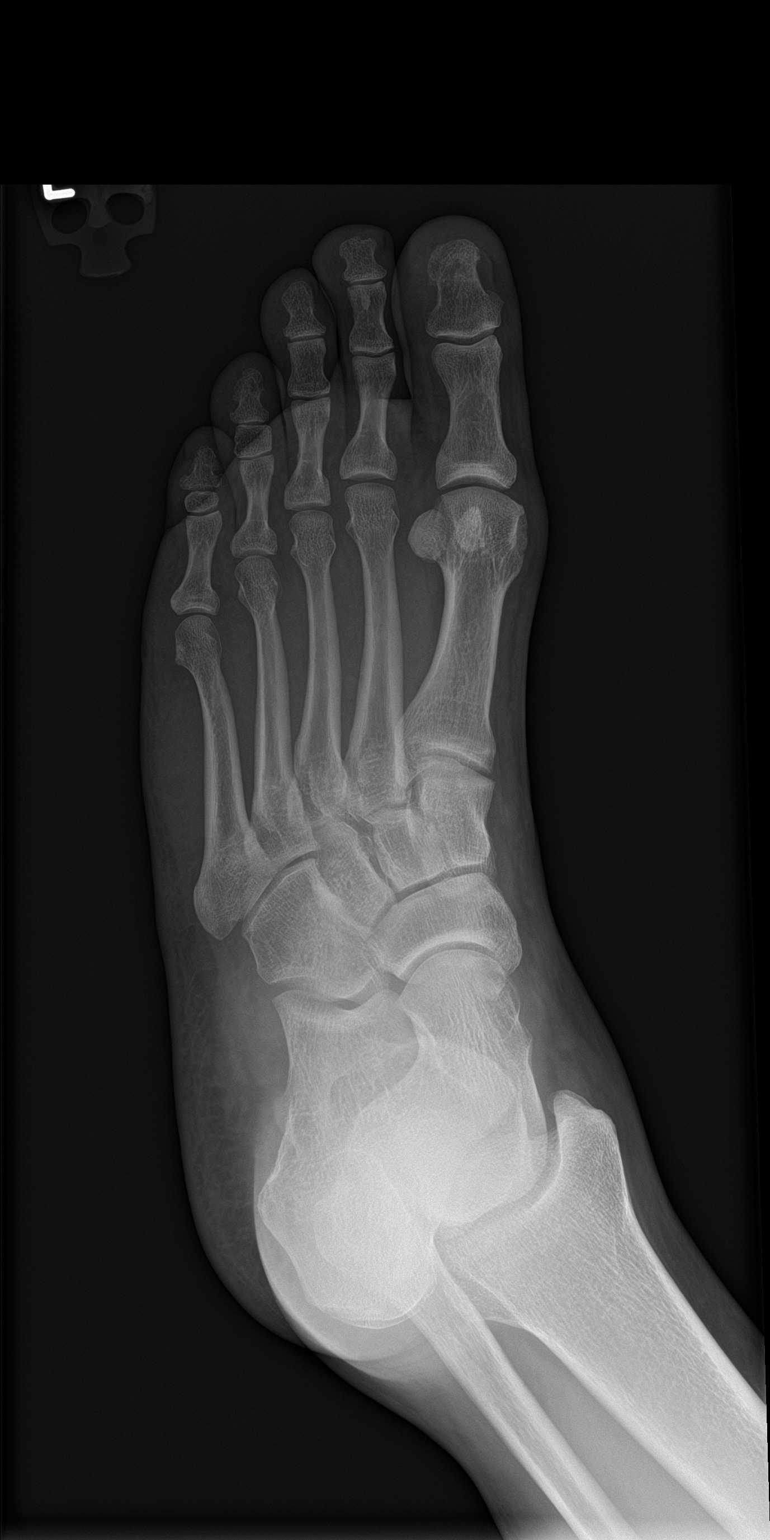

[foot lat]
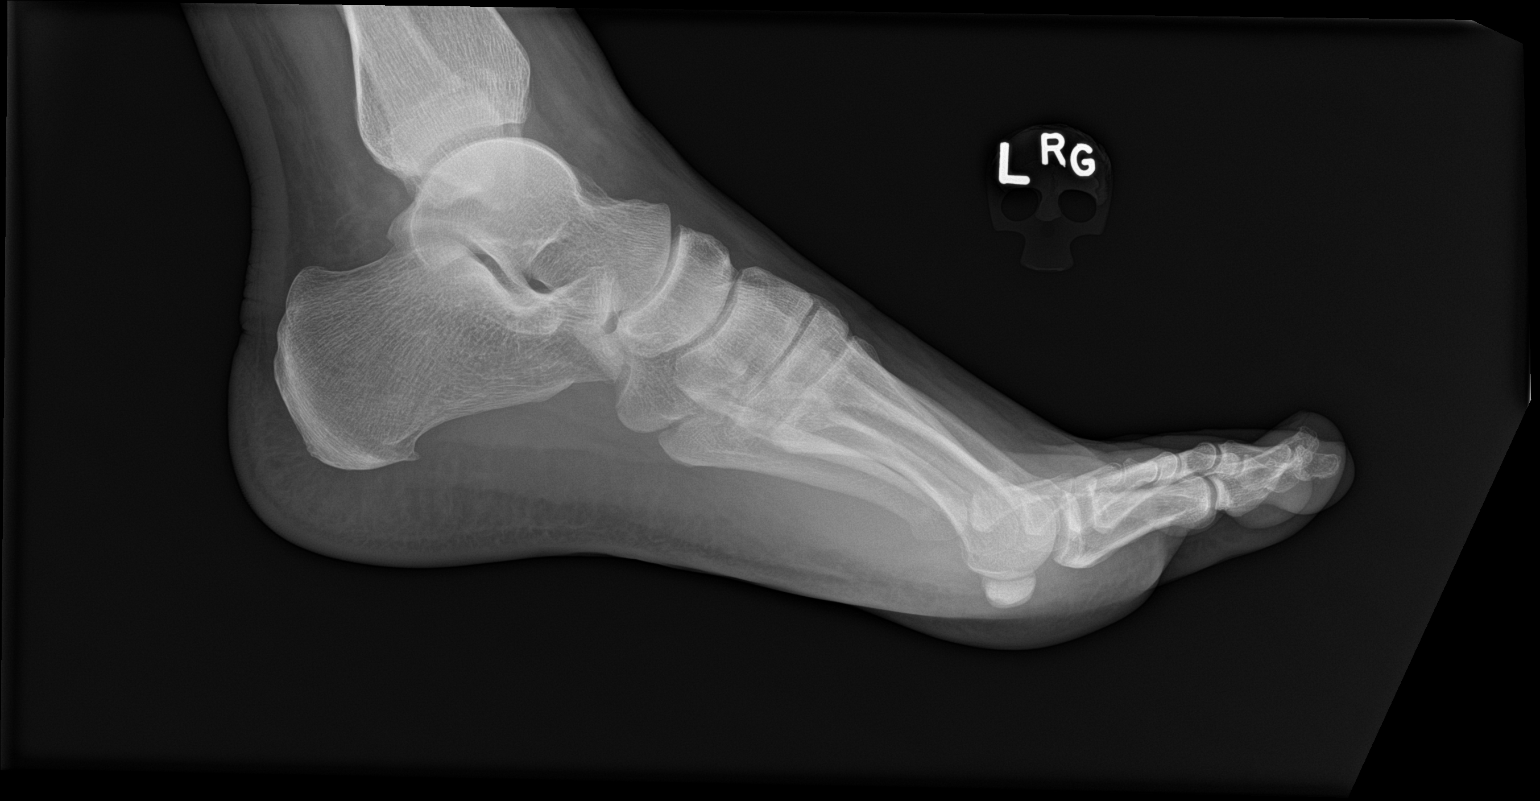

[3 of 3 positions shown; findings below may reference images not displayed]

FINDINGS: No evidence of acute fracture, subluxation or dislocation.

LATERAL soft tissue swelling noted.

Joint space is unremarkable.

No radiopaque foreign bodies.
IMPRESSION: .  Soft tissue swelling without acute bony abnormality

## 2020-08-01 ENCOUNTER — Other Ambulatory Visit: Payer: Self-pay

## 2020-08-01 ENCOUNTER — Encounter (HOSPITAL_COMMUNITY): Payer: Self-pay

## 2020-08-01 ENCOUNTER — Emergency Department (HOSPITAL_COMMUNITY)
Admission: EM | Admit: 2020-08-01 | Discharge: 2020-08-01 | Disposition: A | Discharge status: Home / Self Care | Payer: Managed Care, Other (non HMO) | Attending: Emergency Medicine | Admitting: Emergency Medicine

## 2020-08-01 ENCOUNTER — Emergency Department (HOSPITAL_COMMUNITY): Payer: Managed Care, Other (non HMO)

## 2020-08-01 DIAGNOSIS — I10 Essential (primary) hypertension: Secondary | ICD-10-CM | POA: Diagnosis not present

## 2020-08-01 DIAGNOSIS — Z79899 Other long term (current) drug therapy: Secondary | ICD-10-CM | POA: Insufficient documentation

## 2020-08-01 DIAGNOSIS — E860 Dehydration: Secondary | ICD-10-CM | POA: Insufficient documentation

## 2020-08-01 DIAGNOSIS — R55 Syncope and collapse: Secondary | ICD-10-CM | POA: Insufficient documentation

## 2020-08-01 DIAGNOSIS — R4182 Altered mental status, unspecified: Secondary | ICD-10-CM | POA: Diagnosis not present

## 2020-08-01 DIAGNOSIS — Z87891 Personal history of nicotine dependence: Secondary | ICD-10-CM | POA: Diagnosis not present

## 2020-08-01 HISTORY — DX: Unspecified osteoarthritis, unspecified site: M19.90

## 2020-08-01 HISTORY — DX: Essential (primary) hypertension: I10

## 2020-08-01 LAB — URINALYSIS, ROUTINE W REFLEX MICROSCOPIC
Bilirubin Urine: NEGATIVE
Glucose, UA: NEGATIVE mg/dL
Hgb urine dipstick: NEGATIVE
Ketones, ur: NEGATIVE mg/dL
Leukocytes,Ua: NEGATIVE
Nitrite: NEGATIVE
Protein, ur: NEGATIVE mg/dL
Specific Gravity, Urine: 1.012 (ref 1.005–1.030)
pH: 5 (ref 5.0–8.0)

## 2020-08-01 LAB — CBC WITH DIFFERENTIAL/PLATELET
Abs Immature Granulocytes: 0.08 10*3/uL — ABNORMAL HIGH (ref 0.00–0.07)
Basophils Absolute: 0.1 10*3/uL (ref 0.0–0.1)
Basophils Relative: 1 %
Eosinophils Absolute: 0.2 10*3/uL (ref 0.0–0.5)
Eosinophils Relative: 2 %
HCT: 43.1 % (ref 39.0–52.0)
Hemoglobin: 14.2 g/dL (ref 13.0–17.0)
Immature Granulocytes: 1 %
Lymphocytes Relative: 16 %
Lymphs Abs: 2 10*3/uL (ref 0.7–4.0)
MCH: 30.5 pg (ref 26.0–34.0)
MCHC: 32.9 g/dL (ref 30.0–36.0)
MCV: 92.5 fL (ref 80.0–100.0)
Monocytes Absolute: 1 10*3/uL (ref 0.1–1.0)
Monocytes Relative: 8 %
Neutro Abs: 9.2 10*3/uL — ABNORMAL HIGH (ref 1.7–7.7)
Neutrophils Relative %: 72 %
Platelets: 281 10*3/uL (ref 150–400)
RBC: 4.66 MIL/uL (ref 4.22–5.81)
RDW: 13.5 % (ref 11.5–15.5)
WBC: 12.5 10*3/uL — ABNORMAL HIGH (ref 4.0–10.5)
nRBC: 0 % (ref 0.0–0.2)

## 2020-08-01 LAB — RAPID URINE DRUG SCREEN, HOSP PERFORMED
Amphetamines: NOT DETECTED
Barbiturates: NOT DETECTED
Benzodiazepines: POSITIVE — AB
Cocaine: NOT DETECTED
Opiates: NOT DETECTED
Tetrahydrocannabinol: NOT DETECTED

## 2020-08-01 LAB — COMPREHENSIVE METABOLIC PANEL
ALT: 30 U/L (ref 0–44)
AST: 27 U/L (ref 15–41)
Albumin: 3.9 g/dL (ref 3.5–5.0)
Alkaline Phosphatase: 83 U/L (ref 38–126)
Anion gap: 11 (ref 5–15)
BUN: 7 mg/dL (ref 6–20)
CO2: 25 mmol/L (ref 22–32)
Calcium: 8.9 mg/dL (ref 8.9–10.3)
Chloride: 101 mmol/L (ref 98–111)
Creatinine, Ser: 1.36 mg/dL — ABNORMAL HIGH (ref 0.61–1.24)
GFR, Estimated: >60 mL/min (ref >60)
Glucose, Bld: 129 mg/dL — ABNORMAL HIGH (ref 70–99)
Potassium: 3.4 mmol/L — ABNORMAL LOW (ref 3.5–5.1)
Sodium: 137 mmol/L (ref 135–145)
Total Bilirubin: 0.5 mg/dL (ref 0.3–1.2)
Total Protein: 7.2 g/dL (ref 6.5–8.1)

## 2020-08-01 LAB — CBG MONITORING, ED: Glucose-Capillary: 133 mg/dL — ABNORMAL HIGH (ref 70–99)

## 2020-08-01 LAB — TROPONIN I (HIGH SENSITIVITY): Troponin I (High Sensitivity): 4 ng/L (ref <18)

## 2020-08-01 MED ORDER — SODIUM CHLORIDE 0.9 % IV SOLN: INTRAVENOUS | Status: DC

## 2020-08-01 MED ORDER — SODIUM CHLORIDE 0.9 % IV BOLUS
1000.0000 mL | Freq: Once | INTRAVENOUS | Status: AC
  Administered 2020-08-01: 1000 mL via INTRAVENOUS

## 2020-08-01 NOTE — ED Provider Notes (Signed)
[facility] EMERGENCY DEPARTMENT Provider Note   CSN: [id number] Arrival date & time: 08/01/20  1143     History Chief Complaint  Patient presents with  . Loss of Consciousness    Brandon Fitzpatrick is a 55 y.o. male.  Pt presents to the ED today with AMS.  Pt was at work today [employer].  He was noted to appear pale and diaphoretic.  He had multiple short syncopal episodes lasting about 5 sec according to EMS.  Pt does not recall passing out.  Pt said he has been under a lot of stress.  He went to the ED on 5/15 for back pain and was prescribed oxycodone, valium, and flexeril.  Pt was given 0.4 mg narcan by EMS and he is much more alert.  He was also given 4 mg of zofran and is feeling better now.            Past Medical History:  Diagnosis Date  . Arthritis   . Depression   . GERD (gastroesophageal reflux disease)   . Hypertension     Patient Active Problem List   Diagnosis Date Noted  . Rotator cuff strain 06/15/2015  . Anxiety 04/22/2015  . Carpal tunnel syndrome 04/22/2015  . Cervical pain 04/22/2015  . Blood pressure elevated without history of HTN 04/22/2015  . Acid reflux 04/22/2015  . LBP (low back pain) 04/22/2015  . Gonalgia 04/22/2015  . Plantar fasciitis 04/22/2015  . Pure hyperglyceridemia 04/22/2015    Past Surgical History:  Procedure Laterality Date  . KNEE ARTHROSCOPY  94   rt  . KNEE ARTHROSCOPY  09/12/2011   Procedure: ARTHROSCOPY KNEE;  Surgeon: Nestor Lewandowsky, MD;  Location: [facility];  Service: Orthopedics;  Laterality: Left;  left knee scope, debride chondromalacia partial medial menisectomy removal loose bodies  . KNEE ARTHROSCOPY  04/02/2012   Procedure: ARTHROSCOPY KNEE;  Surgeon: Nestor Lewandowsky, MD;  Location: [facility];  Service: Orthopedics;  Laterality: Left;  WITH DEBRIDEMENT OF CHONDROMALACIA   . WISDOM TOOTH EXTRACTION         No family history on file.  Social  History   Tobacco Use  . Smoking status: Former Smoker    Quit date: 09/08/2003    Years since quitting: 16.9  . Smokeless tobacco: Never Used  Vaping Use  . Vaping Use: Never used  Substance Use Topics  . Alcohol use: Yes    Comment: occ  . Drug use: No    Home Medications Prior to Admission medications   Medication Sig Start Date End Date Taking? Authorizing Provider  bisoprolol-hydrochlorothiazide (ZIAC) 5-6.25 MG tablet Take 1 tablet by mouth daily. 06/18/20  Yes [provider]  diazepam (VALIUM) 5 MG tablet Take 5 mg by mouth 2 (two) times daily as needed for anxiety. 07/27/20  Yes [provider]  doxycycline (VIBRAMYCIN) 100 MG capsule Take 100 mg by mouth 2 (two) times daily. 07/08/20  Yes [provider]  gabapentin (NEURONTIN) 300 MG capsule Take 300 mg by mouth at bedtime. 06/06/20  Yes [provider]  methylPREDNISolone (MEDROL DOSEPAK) 4 MG TBPK tablet Take by mouth See admin instructions. follow package directions 07/26/20  Yes [provider]  mirtazapine (REMERON) 7.5 MG tablet Take 7.5 mg by mouth at bedtime. 06/06/20  Yes [provider]  Testosterone Enanthate (XYOSTED) 100 MG/0.5ML SOAJ Inject 75 mg into the skin once a week. Sundays 01/01/20  Yes [provider]  venlafaxine XR (EFFEXOR-XR) 150 MG 24 hr capsule Take 150 mg by mouth at bedtime. Take with 75mg  capsule for a total of 225mg  every night. 06/18/20  Yes [provider]  venlafaxine XR (EFFEXOR-XR) 75 MG 24 hr capsule Take 75 mg by mouth at bedtime. Take with 150mg  capsule for a total of 225mg  every night.   Yes [provider]    Allergies    Penicillins and Prednisone  Review of Systems   Review of Systems  Gastrointestinal: Positive for nausea and vomiting.  Neurological: Positive for syncope.  All other systems reviewed and are negative.   Physical Exam Updated Vital Signs BP (!) 152/102   Pulse 80   Temp 98.7 F (37.1  C) (Oral)   Resp (!) 22   Ht 6\' 1"  (1.854 m)   Wt 79.4 kg   SpO2 93%   BMI 23.09 kg/m   Physical Exam Vitals and nursing note reviewed.  Constitutional:      Appearance: Normal appearance.  HENT:     Head: Normocephalic and atraumatic.     Right Ear: External ear normal.     Left Ear: External ear normal.     Nose: Nose normal.     Mouth/Throat:     Mouth: Mucous membranes are moist.     Pharynx: Oropharynx is clear.  Eyes:     Extraocular Movements: Extraocular movements intact.     Conjunctiva/sclera: Conjunctivae normal.     Pupils: Pupils are equal, round, and reactive to light.  Cardiovascular:     Rate and Rhythm: Normal rate and regular rhythm.     Pulses: Normal pulses.     Heart sounds: Normal heart sounds.  Pulmonary:     Effort: Pulmonary effort is normal.     Breath sounds: Normal breath sounds.  Abdominal:     General: Abdomen is flat. Bowel sounds are normal.     Palpations: Abdomen is soft.  Musculoskeletal:        General: Normal range of motion.     Cervical back: Normal range of motion and neck supple.  Skin:    General: Skin is warm.     Capillary Refill: Capillary refill takes less than 2 seconds.  Neurological:     General: No focal deficit present.     Mental Status: He is alert and oriented to person, place, and time.  Psychiatric:        Mood and Affect: Mood normal.        Behavior: Behavior normal.        Thought Content: Thought content normal.        Judgment: Judgment normal.     ED Results / Procedures / Treatments   Labs (all labs ordered are listed, but only abnormal results are displayed) Labs Reviewed  CBC WITH DIFFERENTIAL/PLATELET - Abnormal; Notable for the following components:      Result Value   WBC 12.5 (*)    Neutro Abs 9.2 (*)    Abs Immature Granulocytes 0.08 (*)    All other components within normal limits  COMPREHENSIVE METABOLIC PANEL - Abnormal; Notable for the following components:   Potassium 3.4 (*)     Glucose, Bld 129 (*)    Creatinine, Ser 1.36 (*)    All other components within normal limits  URINALYSIS, ROUTINE W REFLEX MICROSCOPIC - Abnormal; Notable for the following components:   APPearance HAZY (*)    All other components within normal limits  RAPID URINE DRUG SCREEN, HOSP  PERFORMED - Abnormal; Notable for the following components:   Benzodiazepines POSITIVE (*)    All other components within normal limits  CBG MONITORING, ED - Abnormal; Notable for the following components:   Glucose-Capillary 133 (*)    All other components within normal limits  TROPONIN I (HIGH SENSITIVITY)  TROPONIN I (HIGH SENSITIVITY)    EKG EKG Interpretation  Date/Time:  Saturday Aug 01 2020 12:05:16 EDT Ventricular Rate:  97 PR Interval:  155 QRS Duration: 96 QT Interval:  345 QTC Calculation: 439 R Axis:   25 Text Interpretation: Sinus rhythm Abnormal R-wave progression, early transition Minimal ST elevation, anterior leads No old tracing to compare Confirmed by Jacalyn Lefevre [id number]) on 08/01/2020 12:10:03 PM   Radiology DG Chest Port 1 View  Result Date: 08/01/2020 CLINICAL DATA:  Syncope. EXAM: PORTABLE CHEST 1 VIEW COMPARISON:  None. FINDINGS: Heart size is exaggerated by low lung volumes. No edema or effusion is present. Subtle asymmetric airspace opacity noted at left base. No edema or effusion is present. No significant right-sided airspace disease is present. IMPRESSION: 1. Subtle asymmetric airspace disease at the left base. While this may represent atelectasis, infection is not excluded. 2. Low lung volumes. Electronically Signed   By: Marin Roberts M.D.   On: 08/01/2020 12:31    Procedures Procedures   Medications Ordered in ED Medications  sodium chloride 0.9 % bolus 1,000 mL (0 mLs Intravenous Stopped 08/01/20 1626)    And  0.9 %  sodium chloride infusion (0 mLs Intravenous Hold 08/01/20 1229)  sodium chloride 0.9 % bolus 1,000 mL (0 mLs Intravenous Stopped 08/01/20  1626)    ED Course  I have reviewed the triage vital signs and the nursing notes.  Pertinent labs & imaging results that were available during my care of the patient were reviewed by me and considered in my medical decision making (see chart for details).    MDM Rules/Calculators/A&P                          Pt is feeling much better after fluids.  He is told to not take the oxycodone and valium that were prescribed on 5/15.  He is to return if worse.  F/u with pcp.  Final Clinical Impression(s) / ED Diagnoses Final diagnoses:  Near syncope  Dehydration    Rx / DC Orders ED Discharge Orders    None       Jacalyn Lefevre, MD 08/01/20 1659

## 2020-08-01 NOTE — ED Triage Notes (Signed)
Pt BIB ems from work; [employer]; looking pale and diaphoretic; 1 episode projectile vomiting; has multiple short syncopal episodes today, lasting approx 5 seconds each; slow to answer questions, lethargic with ems; sleeps when not stimulated, pupils constricted, RR drops to 8; says he has been under stress lately; seen in hospital Sunday for back pain; prescribed oxycodone, valium,and flexeril; denies taking meds today; 0.4mg  narcan given pta; ems reports less lethargy after administration; pt's only complaint is indigestion (chronic, states he has it every day); denies recent injury; hx HTN, has not taken meds today; 4 zofran also given pta; answers questions appropriately  164/100 HR 100 sinus arrythmya with PACs 98% RA after narcan 90% prior) CBG 113 RR 16

## 2020-08-01 NOTE — Discharge Instructions (Signed)
Do not take the oxycodone and valium that were prescribed to you on 5/15.  They are too strong.  Make sure you are sleeping and drinking fluids and eating properly.

## 2021-10-19 IMAGING — DX DG CHEST 1V PORT
1 series · 1 of 1 positions shown · non-contrast
Comparison: None.

CLINICAL DATA: Syncope.

EXAM:
PORTABLE CHEST 1 VIEW

[chest ap]
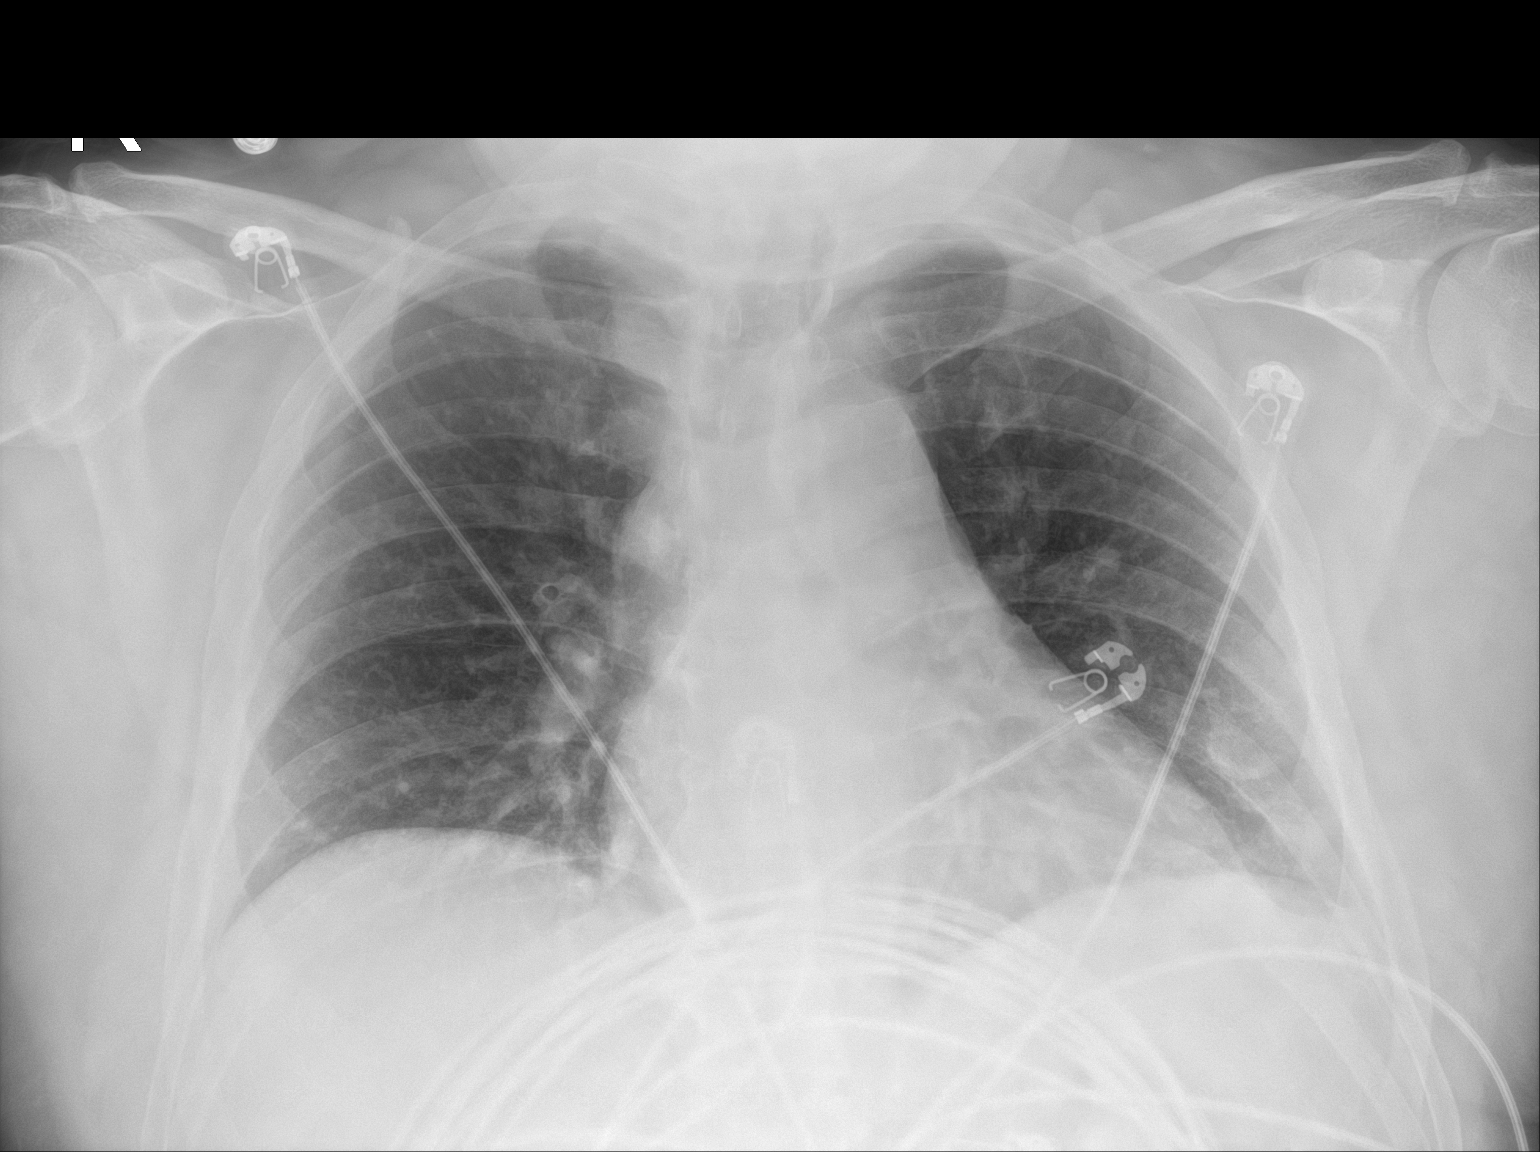

[1 of 1 positions shown; findings below may reference images not displayed]

FINDINGS: Heart size is exaggerated by low lung volumes. No edema or effusion
is present. Subtle asymmetric airspace opacity noted at left base.
No edema or effusion is present. No significant right-sided airspace
disease is present.
IMPRESSION: 1. Subtle asymmetric airspace disease at the left base. While this
may represent atelectasis, infection is not excluded.
2. Low lung volumes.
